# Patient Record
Sex: Male | Born: 1960 | Race: White | Hispanic: No | Marital: Married | Smoking: Former smoker
Health system: Southern US, Community
[De-identification: ages and names within clinical notes are randomized; demographics above are authoritative.]

## PROBLEM LIST (undated history)

## (undated) HISTORY — PX: MOUTH SURGERY: SHX715

---

## 1998-01-10 ENCOUNTER — Other Ambulatory Visit: Admission: RE | Admit: 1998-01-10 | Discharge: 1998-01-10 | Payer: Self-pay | Admitting: Family Medicine

## 2006-05-08 ENCOUNTER — Ambulatory Visit: Payer: Self-pay | Admitting: Internal Medicine

## 2006-05-08 LAB — CONVERTED CEMR LAB
Albumin: 4.1 g/dL (ref 3.5–5.2)
Alkaline Phosphatase: 34 units/L — ABNORMAL LOW (ref 39–117)
Basophils Absolute: 0 10*3/uL (ref 0.0–0.1)
CO2: 30 meq/L (ref 19–32)
Chol/HDL Ratio, serum: 4.6
Creatinine, Ser: 1 mg/dL (ref 0.4–1.5)
Glomerular Filtration Rate, Af Am: 103 mL/min/{1.73_m2}
Glucose, Bld: 88 mg/dL (ref 70–99)
Ketones, ur: NEGATIVE mg/dL
MCHC: 33.5 g/dL (ref 30.0–36.0)
Monocytes Relative: 10.3 % (ref 3.0–11.0)
Neutro Abs: 3.1 10*3/uL (ref 1.4–7.7)
Neutrophils Relative %: 52.6 % (ref 43.0–77.0)
Nitrite: NEGATIVE
Platelets: 235 10*3/uL (ref 150–400)
RBC: 4.75 M/uL (ref 4.22–5.81)
RDW: 12.6 % (ref 11.5–14.6)
Sed Rate: 1 mm/hr (ref 0–20)
Sodium: 142 meq/L (ref 135–145)
Specific Gravity, Urine: 1.015 (ref 1.000–1.03)
Total Bilirubin: 1.2 mg/dL (ref 0.3–1.2)
Total Protein, Urine: NEGATIVE mg/dL
Total Protein: 7.5 g/dL (ref 6.0–8.3)
Triglyceride fasting, serum: 65 mg/dL (ref 0–149)
Urine Glucose: NEGATIVE mg/dL
Urobilinogen, UA: 0.2 (ref 0.0–1.0)

## 2006-05-30 ENCOUNTER — Ambulatory Visit: Payer: Self-pay | Admitting: Internal Medicine

## 2007-03-25 ENCOUNTER — Ambulatory Visit: Payer: Self-pay | Admitting: Internal Medicine

## 2007-03-25 LAB — CONVERTED CEMR LAB
ALT: 24 units/L (ref 0–53)
AST: 40 units/L — ABNORMAL HIGH (ref 0–37)
Albumin: 4 g/dL (ref 3.5–5.2)
Alkaline Phosphatase: 49 units/L (ref 39–117)
BUN: 9 mg/dL (ref 6–23)
Basophils Absolute: 0 10*3/uL (ref 0.0–0.1)
Basophils Relative: 0.5 % (ref 0.0–1.0)
CO2: 29 meq/L (ref 19–32)
Calcium: 9.1 mg/dL (ref 8.4–10.5)
Chloride: 103 meq/L (ref 96–112)
Creatinine, Ser: 0.9 mg/dL (ref 0.4–1.5)
HCT: 43.7 % (ref 39.0–52.0)
Hemoglobin, Urine: NEGATIVE
Lymphocytes Relative: 24.4 % (ref 12.0–46.0)
MCV: 93.5 fL (ref 78.0–100.0)
Monocytes Absolute: 0.6 10*3/uL (ref 0.2–0.7)
Nitrite: NEGATIVE
Platelets: 224 10*3/uL (ref 150–400)
Potassium: 4.3 meq/L (ref 3.5–5.1)
Total Bilirubin: 1.5 mg/dL — ABNORMAL HIGH (ref 0.3–1.2)
Total Protein: 7.6 g/dL (ref 6.0–8.3)
Urine Glucose: NEGATIVE mg/dL
WBC: 6.9 10*3/uL (ref 4.5–10.5)

## 2007-03-28 ENCOUNTER — Emergency Department (HOSPITAL_COMMUNITY): Admission: EM | Admit: 2007-03-28 | Discharge: 2007-03-28 | Payer: Self-pay | Admitting: Emergency Medicine

## 2007-05-16 ENCOUNTER — Encounter: Payer: Self-pay | Admitting: Internal Medicine

## 2008-02-16 ENCOUNTER — Ambulatory Visit: Payer: Self-pay | Admitting: Internal Medicine

## 2008-02-16 ENCOUNTER — Encounter (INDEPENDENT_AMBULATORY_CARE_PROVIDER_SITE_OTHER): Payer: Self-pay | Admitting: *Deleted

## 2008-02-16 DIAGNOSIS — M25519 Pain in unspecified shoulder: Secondary | ICD-10-CM | POA: Insufficient documentation

## 2009-04-06 ENCOUNTER — Ambulatory Visit: Payer: Self-pay | Admitting: Internal Medicine

## 2009-04-06 LAB — CONVERTED CEMR LAB
AST: 18 units/L (ref 0–37)
Albumin: 4.3 g/dL (ref 3.5–5.2)
Alkaline Phosphatase: 33 units/L — ABNORMAL LOW (ref 39–117)
BUN: 17 mg/dL (ref 6–23)
Bilirubin Urine: NEGATIVE
CO2: 30 meq/L (ref 19–32)
Chloride: 105 meq/L (ref 96–112)
Cholesterol: 256 mg/dL — ABNORMAL HIGH (ref 0–200)
Creatinine, Ser: 0.8 mg/dL (ref 0.4–1.5)
Eosinophils Relative: 4 % (ref 0.0–5.0)
Glucose, Bld: 82 mg/dL (ref 70–99)
HCT: 45.9 % (ref 39.0–52.0)
Hemoglobin: 15.5 g/dL (ref 13.0–17.0)
Ketones, ur: NEGATIVE mg/dL
Lymphs Abs: 2 10*3/uL (ref 0.7–4.0)
MCV: 95.9 fL (ref 78.0–100.0)
Monocytes Absolute: 0.6 10*3/uL (ref 0.1–1.0)
Monocytes Relative: 11.6 % (ref 3.0–12.0)
Neutro Abs: 2 10*3/uL (ref 1.4–7.7)
Platelets: 210 10*3/uL (ref 150.0–400.0)
Potassium: 4.3 meq/L (ref 3.5–5.1)
RDW: 12.3 % (ref 11.5–14.6)
Specific Gravity, Urine: 1.02 (ref 1.000–1.030)
Total Bilirubin: 1.3 mg/dL — ABNORMAL HIGH (ref 0.3–1.2)
Total CHOL/HDL Ratio: 5
Urine Glucose: NEGATIVE mg/dL
VLDL: 15.2 mg/dL (ref 0.0–40.0)
pH: 7 (ref 5.0–8.0)

## 2009-04-08 ENCOUNTER — Ambulatory Visit: Payer: Self-pay | Admitting: Internal Medicine

## 2009-04-08 DIAGNOSIS — N529 Male erectile dysfunction, unspecified: Secondary | ICD-10-CM

## 2009-04-08 DIAGNOSIS — R209 Unspecified disturbances of skin sensation: Secondary | ICD-10-CM

## 2009-04-08 DIAGNOSIS — E785 Hyperlipidemia, unspecified: Secondary | ICD-10-CM

## 2010-05-09 ENCOUNTER — Ambulatory Visit: Payer: Self-pay | Admitting: Internal Medicine

## 2010-05-11 LAB — CONVERTED CEMR LAB
ALT: 18 units/L (ref 0–53)
AST: 22 units/L (ref 0–37)
Albumin: 4 g/dL (ref 3.5–5.2)
Alkaline Phosphatase: 37 units/L — ABNORMAL LOW (ref 39–117)
BUN: 11 mg/dL (ref 6–23)
CO2: 30 meq/L (ref 19–32)
Chloride: 106 meq/L (ref 96–112)
Direct LDL: 170.2 mg/dL
Glucose, Bld: 87 mg/dL (ref 70–99)
Potassium: 5.5 meq/L — ABNORMAL HIGH (ref 3.5–5.1)
Sodium: 141 meq/L (ref 135–145)
Total Protein: 6.9 g/dL (ref 6.0–8.3)
VLDL: 14.4 mg/dL (ref 0.0–40.0)

## 2010-06-01 ENCOUNTER — Ambulatory Visit: Payer: Self-pay | Admitting: Internal Medicine

## 2010-06-08 ENCOUNTER — Ambulatory Visit: Payer: Self-pay | Admitting: Internal Medicine

## 2010-06-08 ENCOUNTER — Encounter: Payer: Self-pay | Admitting: Internal Medicine

## 2010-06-09 LAB — CONVERTED CEMR LAB
Basophils Relative: 0.4 % (ref 0.0–3.0)
Eosinophils Relative: 2.1 % (ref 0.0–5.0)
HCT: 44.3 % (ref 39.0–52.0)
Hemoglobin: 15.4 g/dL (ref 13.0–17.0)
Ketones, ur: NEGATIVE mg/dL
Leukocytes, UA: NEGATIVE
Lymphs Abs: 2.3 10*3/uL (ref 0.7–4.0)
Monocytes Relative: 10.1 % (ref 3.0–12.0)
Nitrite: NEGATIVE
PSA: 1.06 ng/mL (ref 0.10–4.00)
Platelets: 216 10*3/uL (ref 150.0–400.0)
RBC: 4.66 M/uL (ref 4.22–5.81)
Specific Gravity, Urine: 1.01 (ref 1.000–1.030)
WBC: 5.9 10*3/uL (ref 4.5–10.5)
pH: 7.5 (ref 5.0–8.0)

## 2010-08-01 ENCOUNTER — Encounter (INDEPENDENT_AMBULATORY_CARE_PROVIDER_SITE_OTHER): Payer: Self-pay | Admitting: *Deleted

## 2010-08-29 NOTE — Assessment & Plan Note (Signed)
Summary: cpx-lb   Vital Signs:  Patient profile:   50 year old male Height:      67 inches Weight:      157 pounds BMI:     24.68 O2 Sat:      96 % on Room air Temp:     98.3 degrees F oral Pulse rate:   59 / minute BP sitting:   130 / 80  (left arm) Cuff size:   regular  Vitals Entered By: Alysia Penna (June 08, 2010 2:06 PM)  O2 Flow:  Room air CC: pt here for cpx /cp sma Comments pt is no longer taking nortriptyline.   CC:  pt here for cpx /cp sma.  History of Present Illness: The patient presents for a preventive health examination   Current Medications (verified): 1)  Ibuprofen 600 Mg  Tabs (Ibuprofen) .... Two Times A Day As Needed 2)  Nortriptyline Hcl 10 Mg Caps (Nortriptyline Hcl) .Marland Kitchen.. 1 By Mouth Qhs 3)  Vitamin D3 1000 Unit  Tabs (Cholecalciferol) .Marland Kitchen.. 1 By Mouth Daily 4)  Cialis 20 Mg Tabs (Tadalafil) .Marland Kitchen.. 1 Tablet Every Other Day As Needed For Erectile Dysfunction  Allergies (verified): 1)  ! Zoloft (Sertraline Hcl)  Past History:  Past Medical History: Last updated: 04/08/2009 H. pylori  041.86 Hyperlipidemia  Past Surgical History: Last updated: 02/16/2008 Denies surgical history  Social History: Last updated: 04/08/2009 Occupation: Hydrologist Married Former Smoker Regular exercise-yes  Family History: Family History Hypertension F melanoma 53, CVA   Review of Systems  The patient denies anorexia, fever, weight loss, weight gain, vision loss, decreased hearing, hoarseness, chest pain, syncope, dyspnea on exertion, peripheral edema, prolonged cough, headaches, hemoptysis, abdominal pain, melena, hematochezia, severe indigestion/heartburn, hematuria, incontinence, genital sores, muscle weakness, suspicious skin lesions, transient blindness, difficulty walking, depression, unusual weight change, abnormal bleeding, enlarged lymph nodes, angioedema, and testicular masses.    Physical Exam  General:   Well-developed,well-nourished,in no acute distress; alert,appropriate and cooperative throughout examination Head:  Normocephalic and atraumatic without obvious abnormalities. No apparent alopecia or balding. Eyes:  No corneal or conjunctival inflammation noted. EOMI. Perrla. Ears:  External ear exam shows no significant lesions or deformities.  Otoscopic examination reveals clear canals, tympanic membranes are intact bilaterally without bulging, retraction, inflammation or discharge. Hearing is grossly normal bilaterally. Nose:  External nasal examination shows no deformity or inflammation. Nasal mucosa are pink and moist without lesions or exudates. Mouth:  Oral mucosa and oropharynx without lesions or exudates.  Teeth in good repair. Neck:  No deformities, masses, or tenderness noted. Lungs:  Normal respiratory effort, chest expands symmetrically. Lungs are clear to auscultation, no crackles or wheezes. Heart:  Normal rate and regular rhythm. S1 and S2 normal without gallop, murmur, click, rub or other extra sounds. Abdomen:  Bowel sounds positive,abdomen soft and non-tender without masses, organomegaly or hernias noted. Rectal:  No external abnormalities noted. Normal sphincter tone. No rectal masses or tenderness. G(-) Genitalia:  Testes bilaterally descended without nodularity, tenderness or masses. No scrotal masses or lesions. No penis lesions or urethral discharge. Prostate:  Prostate gland firm and smooth, no enlargement, nodularity, tenderness, mass, asymmetry or induration. Msk:  R clavicle is separated upwards in the [facility] joint; NT; atrophic anter. deltoid. AC is not tender w/ROM Pulses:  R and L carotid,radial,femoral,dorsalis pedis and posterior tibial pulses are full and equal bilaterally Extremities:  No clubbing, cyanosis, edema, or deformity noted with normal full range of motion of all joints.   Neurologic:  No  cranial nerve deficits noted. Station and gait are normal. Plantar  reflexes are down-going bilaterally. DTRs are symmetrical throughout. Sensory, motor and coordinative functions appear intact. Skin:  Intact without suspicious lesions or rashes Cervical Nodes:  No lymphadenopathy noted Psych:  Cognition and judgment appear intact. Alert and cooperative with normal attention span and concentration. No apparent delusions, illusions, hallucinations   Impression & Recommendations:  Problem # 1:  PHYSICAL EXAMINATION (ICD-V70.0) Assessment New Health and age related issues were discussed. Available screening tests and vaccinations were discussed as well. Healthy life style including good diet and exercise was discussed.  Labs ordered Orders: EKG w/ Interpretation (93000) Gastroenterology Referral (GI) TLB-CBC Platelet - w/Differential (85025-CBCD) TLB-PSA (Prostate Specific Antigen) (84153-PSA) TLB-Udip ONLY (81003-UDIP) TLB-TSH (Thyroid Stimulating Hormone) (84443-TSH)  Problem # 2:  HYPERLIPIDEMIA (ICD-272.4) Assessment: Comment Only Declined statins  Problem # 3:  ERECTILE DYSFUNCTION (FIE-332.95) Assessment: Unchanged  The following medications were removed from the medication list:    Cialis 20 Mg Tabs (Tadalafil) .Marland Kitchen... 1 tablet every other day as needed for erectile dysfunction His updated medication list for this problem includes:    Viagra 100 Mg Tabs (Sildenafil citrate) .Marland Kitchen... 1 by mouth once daily prn  Complete Medication List: 1)  Vitamin D3 1000 Unit Tabs (Cholecalciferol) .Marland Kitchen.. 1 by mouth daily 2)  Viagra 100 Mg Tabs (Sildenafil citrate) .Marland Kitchen.. 1 by mouth once daily prn  Other Orders: Tdap => 12yrs IM (18841) Admin 1st Vaccine (66063)  Patient Instructions: 1)  Please schedule a follow-up appointment in 1 year with labs. Prescriptions: VIAGRA 100 MG TABS (SILDENAFIL CITRATE) 1 by mouth once daily prn  #12 x 6   Entered and Authorized by:   Tresa Garter MD   Signed by:   Tresa Garter MD on 06/08/2010   Method used:    Print then Give to Patient   RxID:   [id number]    Orders Added: 1)  EKG w/ Interpretation [93000] 2)  Tdap => 65yrs IM [90715] 3)  Admin 1st Vaccine [90471] 4)  Gastroenterology Referral [GI] 5)  TLB-CBC Platelet - w/Differential [85025-CBCD] 6)  TLB-PSA (Prostate Specific Antigen) [84153-PSA] 7)  TLB-Udip ONLY [81003-UDIP] 8)  TLB-TSH (Thyroid Stimulating Hormone) [84443-TSH] 9)  Est. Patient age 69-64 [83]   Immunizations Administered:  Tetanus Vaccine:    Vaccine Type: Tdap    Site: left deltoid    Mfr: GlaxoSmithKline    Dose: 0.5 ml    Route: IM    Given by: Alysia Penna    Exp. Date: 05/18/2012    Lot #: [id number]   Immunizations Administered:  Tetanus Vaccine:    Vaccine Type: Tdap    Site: left deltoid    Mfr: GlaxoSmithKline    Dose: 0.5 ml    Route: IM    Given by: Alysia Penna    Exp. Date: 05/18/2012    Lot #: [id number]

## 2010-08-31 NOTE — Letter (Signed)
Summary: Referral - not able to see patient  [facility]  [address] [place], [place] [address]   Phone: [phone]  Fax: [phone]    August 01, 2010   Georgina Quint. Plotnikov, M.D. [address] [place], [place] [address]   Re:   MCKENNON ZWART DOB:  25-Jan-1961 MRN:   [id number]    Dear Dr. Posey Rea:  Thank you for your kind referral of the above patient.  We have attempted to schedule the recommended procedure Screening Colonoscopy but have not been able to schedule because:   X  The patient was not available by phone and/or has not returned our calls.  ___ The patient declined to schedule the procedure at this time.  We appreciate the referral and hope that we will have the opportunity to treat this patient in the future.    Sincerely,    [facility] [phone]

## 2010-11-01 ENCOUNTER — Other Ambulatory Visit: Payer: Self-pay | Admitting: Internal Medicine

## 2010-11-02 NOTE — Telephone Encounter (Signed)
Med is not active in [facility]....Marland KitchenMarland KitchenOk to Rf?

## 2010-11-06 NOTE — Telephone Encounter (Signed)
OK to fill this prescription with additional refills x1 Thank you!  

## 2010-12-15 NOTE — Assessment & Plan Note (Signed)
[facility]                             PRIMARY CARE OFFICE NOTE   HAMDI, KLEY                     MRN:          [id number]  DATE:05/08/2006                            DOB:          1960/11/08    The patient is a 50 year old male who presents for well examination.  Past  medical history, family history, social history as per April 02, 2000,  note.  He plays soccer twice a week, continues to work for [employer].  Quit  smoking 3-4 years ago.   MEDICINES:  None at present.   ALLERGIES:  ZOLOFT caused nausea.   REVIEW OF SYSTEMS:  No chest pain or shortness of breath.  Had some rectal  pain after a soccer game the other day.  No blood or discharge, no diarrhea.  Joint aches after soccer games.  One to two nights a month he has a burning  sensation in the feet that wakes him up from sleep and he has to use a  spiked roller.  The rest is negative.   PHYSICAL:  VITAL SIGNS:  Blood pressure 128/84, pulse 58, temperature 98.7,  weight 160 pounds.  GENERAL:  Looks well.  HEENT:  With moist mucosa.  NECK:  Supple, no thyromegaly or bruit.  LUNGS:  Clear, no wheeze or rales.  HEART:  S1, S2, no murmur, no gallop.  ABDOMEN:  Soft, nontender, no organomegaly or mass felt.  LOWER EXTREMITIES:  Without edema.  NEUROLOGIC:  He is alert, oriented and cooperative.  Denies being depressed.  SKIN:  There are a number of moles with somewhat irregular color and shape -  two on the right foot dorsally, one in the right inguinal area, and one on  the right upper buttock.  RECTAL:  Reveals normal prostate.  There is an oval, mobile, slightly  sensitive mass at 6 o'clock right past the anus.  Stool guaiac negative.  MUSCULOSKELETAL:  Joints without deformities.   Laboratories done this morning pending.   ASSESSMENT AND PLAN:  Normal wellness examination.  Age/health related  issues discussed, healthy lifestyle discussed.  Repeat exam in 12 months.  Will  review labs later.  May need to get a chest x-ray next year.  Refused a  flu shot.            ______________________________  Georgina Quint. Plotnikov, MD   AVP/MedQ DD:  05/08/2006 DT:  05/10/2006 Job #:  [id number]

## 2010-12-15 NOTE — Assessment & Plan Note (Signed)
[facility]                             PRIMARY CARE OFFICE NOTE   DUGAN, VANHOESEN                     MRN:          [id number]  DATE:05/30/2006                            DOB:          [birth year]    PROCEDURE:  Skin biopsy.   INDICATION:  Moles with irregular color.  Family history of melanoma.  The  risks including incomplete procedure, bleeding, infection, and others as  well as benefits were explained to the patient in detail.  He agreed to  proceed.   He was placed in decubitus position.  Mole #1, measuring 6 mm, right groin  area prepped with Betadine and alcohol.  Local with 0.5 mL with 2% lidocaine  with epinephrine.  Shave biopsy with Dermablade performed.  Wound treated  with Hyfrecator, Band-Aid with antibiotic ointment.  Tolerated well.  Complications:  None.  Specimen sent to the lab.   Mole #2, measuring 3 mm, right lateral foot was removed in identical  fashion.  Specimen sent to the lab.   Mole #3, measuring 4 mm, right medial foot was removed in the identical  fashion.  Specimen sent to the lab.   Mole #4, measuring 3 mm, right lumbar area was removed in the identical  fashion.  Specimen sent to the lab.   Mole #5, measuring 4 mm, left lumbar area was removed in the identical  fashion. Specimen sent to the lab.  Tolerated this well.  Complications  none.    ______________________________  Georgina Quint. Plotnikov, MD    AVP/MedQ  DD: 05/30/2006  DT: 05/30/2006  Job #: [id number]

## 2010-12-15 NOTE — Assessment & Plan Note (Signed)
[facility]                             PRIMARY CARE OFFICE NOTE   Chase Bennett, Chase Bennett                     MRN:          [id number]  DATE:05/08/2006                            DOB:          [birth year]    The patient is a 50 year old male who presents today to address complaints  with joint aches after soccer; burning sensation in the feet which is  severe, one to two times a month, usually at nighttime; rectal pain, one  episode; moles.   Past medical history, family history, social history as per April 02, 2000.  He plays soccer twice a week.   MEDICINES:  None regular.   REVIEW OF SYSTEMS:  As above.   PHYSICAL EXAMINATION:  VITAL SIGNS:  Blood pressure 128/84, pulse 58,  temperature 98.7, weight 160 pounds.  GENERAL:  Looks well.  HEENT:  Moist mucosa.  NECK:  Supple, no thyromegaly or bruit.  LUNGS:  Clear, no wheeze or rales.  HEART:  S1, S2.  No murmur, no gallop.  ABDOMEN:  Soft, nontender.  No organomegaly, no mass felt.  RECTAL:  Exam as described in previous note.  SKIN:  Exam as described in previous note.   Labs pending from this morning.   ASSESSMENT AND PLAN:  1. Arthralgias after soccer game.  Prescribed ibuprofen 600 p.o. b.i.d. to      t.i.d. p.r.n., take with meals.  No signs of inflammatory arthritis.  2. Burning paresthesia in the feet, sporadic.  There is no pattern.  Could      be allergic.  Just in case, I gave him Neurontin 100 mg one to two at      night.  Side effects discussed.  He will try to figure out what is      really causing it.  Check B12 and sed rate in addition.  3. Rectal pain, likely a hemorrhoid.  Proctocort HC suppository one twice      a day for 10 days      prescribed.  I will see him back in about a month.  Will do an office      exam.  4. Moles with family history of melanoma.  Will schedule biopsy in a month      of four moles.            ______________________________  Georgina Quint Plotnikov, MD   AVP/MedQ DD:  05/08/2006 DT:  05/10/2006 Job #:  [id number]

## 2011-05-11 LAB — LIPASE, BLOOD: Lipase: 22

## 2011-05-11 LAB — DIFFERENTIAL
Basophils Absolute: 0
Basophils Relative: 0
Eosinophils Absolute: 0
Eosinophils Relative: 0
Monocytes Absolute: 0.3
Monocytes Relative: 4
Neutro Abs: 7.5

## 2011-05-11 LAB — URINALYSIS, ROUTINE W REFLEX MICROSCOPIC
Bilirubin Urine: NEGATIVE
Glucose, UA: NEGATIVE
Ketones, ur: >80 — AB
Nitrite: NEGATIVE
Specific Gravity, Urine: 1.022
pH: 7

## 2011-05-11 LAB — CBC
RBC: 5.03
WBC: 8.5

## 2011-05-11 LAB — HEPATIC FUNCTION PANEL
Bilirubin, Direct: 0.2
Indirect Bilirubin: 0.9
Total Bilirubin: 1.1

## 2011-05-11 LAB — I-STAT 8, (EC8 V) (CONVERTED LAB)
Acid-base deficit: 2
Glucose, Bld: 122 — ABNORMAL HIGH
TCO2: 23
pCO2, Ven: 33.7 — ABNORMAL LOW
pH, Ven: 7.422 — ABNORMAL HIGH

## 2011-05-11 LAB — POCT I-STAT CREATININE
Creatinine, Ser: 0.8
Operator id: 272551

## 2012-06-03 ENCOUNTER — Other Ambulatory Visit (INDEPENDENT_AMBULATORY_CARE_PROVIDER_SITE_OTHER): Payer: Self-pay

## 2012-06-03 ENCOUNTER — Other Ambulatory Visit: Payer: Self-pay | Admitting: *Deleted

## 2012-06-03 DIAGNOSIS — Z0389 Encounter for observation for other suspected diseases and conditions ruled out: Secondary | ICD-10-CM

## 2012-06-03 DIAGNOSIS — Z0000 Encounter for general adult medical examination without abnormal findings: Secondary | ICD-10-CM

## 2012-06-03 DIAGNOSIS — E785 Hyperlipidemia, unspecified: Secondary | ICD-10-CM

## 2012-06-03 LAB — URINALYSIS, ROUTINE W REFLEX MICROSCOPIC
Ketones, ur: NEGATIVE
Leukocytes, UA: NEGATIVE
Specific Gravity, Urine: 1.025 (ref 1.000–1.030)
Total Protein, Urine: NEGATIVE
Urine Glucose: NEGATIVE
pH: 6.5 (ref 5.0–8.0)

## 2012-06-03 LAB — PSA: PSA: 1.36 ng/mL (ref 0.10–4.00)

## 2012-06-03 LAB — BASIC METABOLIC PANEL
Calcium: 9.2 mg/dL (ref 8.4–10.5)
GFR: 97.82 mL/min (ref >60.00)
Potassium: 6.1 mEq/L (ref 3.5–5.1)
Sodium: 139 mEq/L (ref 135–145)

## 2012-06-03 LAB — LIPID PANEL
HDL: 68.2 mg/dL (ref >39.00)
Total CHOL/HDL Ratio: 4
Triglycerides: 83 mg/dL (ref 0.0–149.0)
VLDL: 16.6 mg/dL (ref 0.0–40.0)

## 2012-06-03 LAB — CBC WITH DIFFERENTIAL/PLATELET
Basophils Relative: 0.6 % (ref 0.0–3.0)
Eosinophils Relative: 3.5 % (ref 0.0–5.0)
Hemoglobin: 15.1 g/dL (ref 13.0–17.0)
Lymphocytes Relative: 39.1 % (ref 12.0–46.0)
MCHC: 32.8 g/dL (ref 30.0–36.0)
MCV: 96.3 fl (ref 78.0–100.0)
Neutro Abs: 2.4 10*3/uL (ref 1.4–7.7)
Neutrophils Relative %: 47.3 % (ref 43.0–77.0)
RBC: 4.78 Mil/uL (ref 4.22–5.81)
WBC: 5 10*3/uL (ref 4.5–10.5)

## 2012-06-03 LAB — HEPATIC FUNCTION PANEL
AST: 23 U/L (ref 0–37)
Albumin: 4.1 g/dL (ref 3.5–5.2)
Alkaline Phosphatase: 37 U/L — ABNORMAL LOW (ref 39–117)
Bilirubin, Direct: 0.1 mg/dL (ref 0.0–0.3)
Total Bilirubin: 0.9 mg/dL (ref 0.3–1.2)

## 2012-06-04 ENCOUNTER — Telehealth: Payer: Self-pay | Admitting: Internal Medicine

## 2012-06-04 DIAGNOSIS — R899 Unspecified abnormal finding in specimens from other organs, systems and tissues: Secondary | ICD-10-CM

## 2012-06-04 NOTE — Telephone Encounter (Signed)
Pt informed

## 2012-06-04 NOTE — Telephone Encounter (Signed)
pls ask the pt to come back: please re-draw BMET due to elev K. I think it is a lab error. Thx

## 2012-06-05 ENCOUNTER — Other Ambulatory Visit (INDEPENDENT_AMBULATORY_CARE_PROVIDER_SITE_OTHER): Payer: Self-pay

## 2012-06-05 DIAGNOSIS — R6889 Other general symptoms and signs: Secondary | ICD-10-CM

## 2012-06-05 DIAGNOSIS — R899 Unspecified abnormal finding in specimens from other organs, systems and tissues: Secondary | ICD-10-CM

## 2012-06-05 LAB — BASIC METABOLIC PANEL
Calcium: 9.2 mg/dL (ref 8.4–10.5)
GFR: 99.13 mL/min (ref >60.00)
Glucose, Bld: 88 mg/dL (ref 70–99)
Sodium: 139 mEq/L (ref 135–145)

## 2012-06-10 ENCOUNTER — Ambulatory Visit (INDEPENDENT_AMBULATORY_CARE_PROVIDER_SITE_OTHER): Payer: BC Managed Care – PPO | Admitting: Internal Medicine

## 2012-06-10 ENCOUNTER — Encounter: Payer: Self-pay | Admitting: Internal Medicine

## 2012-06-10 VITALS — BP 130/90 | HR 80 | Temp 97.7°F | Resp 16 | Ht 69.0 in | Wt 150.0 lb

## 2012-06-10 DIAGNOSIS — J3489 Other specified disorders of nose and nasal sinuses: Secondary | ICD-10-CM

## 2012-06-10 DIAGNOSIS — E785 Hyperlipidemia, unspecified: Secondary | ICD-10-CM

## 2012-06-10 DIAGNOSIS — Z0000 Encounter for general adult medical examination without abnormal findings: Secondary | ICD-10-CM

## 2012-06-10 DIAGNOSIS — H547 Unspecified visual loss: Secondary | ICD-10-CM

## 2012-06-10 MED ORDER — MUPIROCIN 2 % EX OINT: TOPICAL_OINTMENT | CUTANEOUS | Status: DC

## 2012-06-10 MED ORDER — HYDROCORTISONE ACETATE 30 MG RE SUPP: RECTAL | Status: DC

## 2012-06-10 MED ORDER — VITAMIN D 1000 UNITS PO TABS: 1000.0000 [IU] | ORAL_TABLET | Freq: Every day | ORAL | Status: DC

## 2012-06-10 NOTE — Assessment & Plan Note (Signed)
Irrigate nose w/Netty pot Mupirocin oint bid

## 2012-06-10 NOTE — Assessment & Plan Note (Addendum)
Chronic - high HDL,LDL Declined Rx

## 2012-06-10 NOTE — Assessment & Plan Note (Addendum)
We discussed age appropriate health related issues, including available/recomended screening tests and vaccinations. We discussed a need for adhering to healthy diet and exercise. Labs/EKG were reviewed/ordered. All questions were answered. He declined vaccinations, colonoscopy Opth cons Skin exams

## 2012-06-10 NOTE — Progress Notes (Signed)
Subjective:    Patient ID: Chase Bennett, male    DOB: 04-29-1961, 51 y.o.   MRN: [id number]  HPI  The patient is here for a wellness exam. The patient has been doing well overall without major physical or psychological issues going on lately. C/o a sore in the nose x 2-3 wks  Wt Readings from Last 3 Encounters:  06/10/12 150 lb (68.04 kg)  06/08/10 157 lb (71.215 kg)  04/08/09 154 lb (69.854 kg)   Wt Readings from Last 3 Encounters:  06/10/12 150 lb (68.04 kg)  06/08/10 157 lb (71.215 kg)  04/08/09 154 lb (69.854 kg)      Review of Systems  Constitutional: Negative for appetite change, fatigue and unexpected weight change.  HENT: Negative for nosebleeds, congestion, sore throat, sneezing, trouble swallowing and neck pain.   Eyes: Negative for itching and visual disturbance.  Respiratory: Negative for cough.   Cardiovascular: Negative for chest pain, palpitations and leg swelling.  Gastrointestinal: Negative for nausea, diarrhea, blood in stool and abdominal distention.  Genitourinary: Negative for frequency and hematuria.  Musculoskeletal: Negative for back pain, joint swelling and gait problem.  Skin: Negative for rash.  Neurological: Negative for dizziness, tremors, speech difficulty and weakness.  Psychiatric/Behavioral: Negative for sleep disturbance, dysphoric mood and agitation. The patient is not nervous/anxious.        Objective:   Physical Exam  Constitutional: He is oriented to person, place, and time. He appears well-developed and well-nourished. No distress.  HENT:  Head: Normocephalic and atraumatic.  Right Ear: External ear normal.  Left Ear: External ear normal.  Nose: Nose normal.  Mouth/Throat: Oropharynx is clear and moist. No oropharyngeal exudate.  Eyes: Conjunctivae normal and EOM are normal. Pupils are equal, round, and reactive to light. Right eye exhibits no discharge. Left eye exhibits no discharge. No scleral icterus.  Neck: Normal range of  motion. Neck supple. No JVD present. No tracheal deviation present. No thyromegaly present.  Cardiovascular: Normal rate, regular rhythm, normal heart sounds and intact distal pulses.  Exam reveals no gallop and no friction rub.   No murmur heard. Pulmonary/Chest: Effort normal and breath sounds normal. No stridor. No respiratory distress. He has no wheezes. He has no rales. He exhibits no tenderness.  Abdominal: Soft. Bowel sounds are normal. He exhibits no distension and no mass. There is no tenderness. There is no rebound and no guarding.  Genitourinary: Rectum normal, prostate normal and penis normal. Guaiac negative stool. No penile tenderness.  Musculoskeletal: Normal range of motion. He exhibits no edema and no tenderness.  Lymphadenopathy:    He has no cervical adenopathy.  Neurological: He is alert and oriented to person, place, and time. He has normal reflexes. No cranial nerve deficit. He exhibits normal muscle tone. Coordination normal.  Skin: Skin is warm and dry. No rash noted. He is not diaphoretic. No erythema. No pallor.  Psychiatric: He has a normal mood and affect. His behavior is normal. Judgment and thought content normal.    Lab Results  Component Value Date   WBC 5.0 06/03/2012   HGB 15.1 06/03/2012   HCT 46.0 06/03/2012   PLT 227.0 06/03/2012   GLUCOSE 88 06/05/2012   CHOL 286* 06/03/2012   TRIG 83.0 06/03/2012   HDL 68.20 06/03/2012   LDLDIRECT 186.0 06/03/2012   ALT 23 06/03/2012   AST 23 06/03/2012   NA 139 06/05/2012   K 4.7 06/05/2012   CL 104 06/05/2012   CREATININE 0.9 06/05/2012   BUN 15  06/05/2012   CO2 30 06/05/2012   TSH 3.12 06/03/2012   PSA 1.36 06/03/2012         Assessment & Plan:

## 2012-06-11 ENCOUNTER — Encounter: Payer: Self-pay | Admitting: Internal Medicine

## 2013-11-03 ENCOUNTER — Ambulatory Visit (INDEPENDENT_AMBULATORY_CARE_PROVIDER_SITE_OTHER): Payer: PRIVATE HEALTH INSURANCE | Admitting: Internal Medicine

## 2013-11-03 ENCOUNTER — Encounter: Payer: Self-pay | Admitting: Internal Medicine

## 2013-11-03 ENCOUNTER — Other Ambulatory Visit (INDEPENDENT_AMBULATORY_CARE_PROVIDER_SITE_OTHER): Payer: PRIVATE HEALTH INSURANCE

## 2013-11-03 VITALS — BP 140/84 | HR 80 | Resp 16 | Ht 69.25 in | Wt 146.0 lb

## 2013-11-03 DIAGNOSIS — Z0000 Encounter for general adult medical examination without abnormal findings: Secondary | ICD-10-CM

## 2013-11-03 LAB — PSA: PSA: 1.34 ng/mL (ref 0.10–4.00)

## 2013-11-03 LAB — URINALYSIS
BILIRUBIN URINE: NEGATIVE
Hgb urine dipstick: NEGATIVE
KETONES UR: NEGATIVE
LEUKOCYTES UA: NEGATIVE
Nitrite: NEGATIVE
PH: 8 (ref 5.0–8.0)
SPECIFIC GRAVITY, URINE: 1.02 (ref 1.000–1.030)
TOTAL PROTEIN, URINE-UPE24: NEGATIVE
Urine Glucose: NEGATIVE
Urobilinogen, UA: 0.2 (ref 0.0–1.0)

## 2013-11-03 LAB — CBC WITH DIFFERENTIAL/PLATELET
BASOS ABS: 0 10*3/uL (ref 0.0–0.1)
Basophils Relative: 0.5 % (ref 0.0–3.0)
EOS ABS: 0.1 10*3/uL (ref 0.0–0.7)
Eosinophils Relative: 1.9 % (ref 0.0–5.0)
HEMATOCRIT: 44.1 % (ref 39.0–52.0)
HEMOGLOBIN: 15.1 g/dL (ref 13.0–17.0)
LYMPHS ABS: 1.6 10*3/uL (ref 0.7–4.0)
Lymphocytes Relative: 35.2 % (ref 12.0–46.0)
MCHC: 34.2 g/dL (ref 30.0–36.0)
MCV: 93.6 fl (ref 78.0–100.0)
MONO ABS: 0.4 10*3/uL (ref 0.1–1.0)
MONOS PCT: 9.4 % (ref 3.0–12.0)
NEUTROS ABS: 2.4 10*3/uL (ref 1.4–7.7)
Neutrophils Relative %: 53 % (ref 43.0–77.0)
PLATELETS: 225 10*3/uL (ref 150.0–400.0)
RBC: 4.71 Mil/uL (ref 4.22–5.81)
RDW: 12.9 % (ref 11.5–14.6)
WBC: 4.6 10*3/uL (ref 4.5–10.5)

## 2013-11-03 LAB — LIPID PANEL
CHOLESTEROL: 205 mg/dL — AB (ref 0–200)
HDL: 48.4 mg/dL (ref >39.00)
LDL Cholesterol: 135 mg/dL — ABNORMAL HIGH (ref 0–99)
Total CHOL/HDL Ratio: 4
Triglycerides: 109 mg/dL (ref 0.0–149.0)
VLDL: 21.8 mg/dL (ref 0.0–40.0)

## 2013-11-03 LAB — BASIC METABOLIC PANEL
BUN: 9 mg/dL (ref 6–23)
CO2: 28 meq/L (ref 19–32)
Calcium: 9.3 mg/dL (ref 8.4–10.5)
Chloride: 104 mEq/L (ref 96–112)
Creatinine, Ser: 0.7 mg/dL (ref 0.4–1.5)
GFR: 133.82 mL/min (ref >60.00)
GLUCOSE: 79 mg/dL (ref 70–99)
POTASSIUM: 4.8 meq/L (ref 3.5–5.1)
Sodium: 140 mEq/L (ref 135–145)

## 2013-11-03 LAB — HEPATIC FUNCTION PANEL
ALK PHOS: 40 U/L (ref 39–117)
ALT: 16 U/L (ref 0–53)
AST: 18 U/L (ref 0–37)
Albumin: 4.2 g/dL (ref 3.5–5.2)
BILIRUBIN DIRECT: 0.2 mg/dL (ref 0.0–0.3)
BILIRUBIN TOTAL: 1.4 mg/dL — AB (ref 0.3–1.2)
TOTAL PROTEIN: 6.9 g/dL (ref 6.0–8.3)

## 2013-11-03 LAB — TSH: TSH: 2.52 u[IU]/mL (ref 0.35–5.50)

## 2013-11-03 MED ORDER — HYDROCORTISONE ACETATE 30 MG RE SUPP: RECTAL | Status: DC

## 2013-11-03 MED ORDER — ACYCLOVIR 400 MG PO TABS: 400.0000 mg | ORAL_TABLET | Freq: Three times a day (TID) | ORAL | Status: DC

## 2013-11-03 NOTE — Assessment & Plan Note (Addendum)
We discussed age appropriate health related issues, including available/recomended screening tests and vaccinations. We discussed a need for adhering to healthy diet and exercise. Labs/EKG were reviewed/ordered. All questions were answered. He declined vaccinations, colonoscopy. Cologuard discussed too: declines at the moment.

## 2013-11-03 NOTE — Progress Notes (Signed)
Pre visit review using our clinic review tool, if applicable. No additional management support is needed unless otherwise documented below in the visit note. 

## 2013-11-03 NOTE — Progress Notes (Signed)
Patient ID: Chase Bennett, male   DOB: [birth year], 53 y.o.   MRN: [id number]  Subjective:    HPI  The patient is here for a wellness exam. The patient has been doing well overall without major physical or psychological issues going on lately.   Wt Readings from Last 3 Encounters:  11/03/13 146 lb (66.225 kg)  06/10/12 150 lb (68.04 kg)  06/08/10 157 lb (71.215 kg)   Wt Readings from Last 3 Encounters:  11/03/13 146 lb (66.225 kg)  06/10/12 150 lb (68.04 kg)  06/08/10 157 lb (71.215 kg)      Review of Systems  Constitutional: Negative for appetite change, fatigue and unexpected weight change.  HENT: Negative for congestion, nosebleeds, sneezing, sore throat and trouble swallowing.   Eyes: Negative for itching and visual disturbance.  Respiratory: Negative for cough.   Cardiovascular: Negative for chest pain, palpitations and leg swelling.  Gastrointestinal: Negative for nausea, diarrhea, blood in stool and abdominal distention.  Genitourinary: Negative for frequency and hematuria.  Musculoskeletal: Negative for back pain, gait problem, joint swelling and neck pain.  Skin: Negative for rash.  Neurological: Negative for dizziness, tremors, speech difficulty and weakness.  Psychiatric/Behavioral: Negative for sleep disturbance, dysphoric mood and agitation. The patient is not nervous/anxious.        Objective:   Physical Exam  Constitutional: He is oriented to person, place, and time. He appears well-developed and well-nourished. No distress.  HENT:  Head: Normocephalic and atraumatic.  Right Ear: External ear normal.  Left Ear: External ear normal.  Nose: Nose normal.  Mouth/Throat: Oropharynx is clear and moist. No oropharyngeal exudate.  Eyes: Conjunctivae and EOM are normal. Pupils are equal, round, and reactive to light. Right eye exhibits no discharge. Left eye exhibits no discharge. No scleral icterus.  Neck: Normal range of motion. Neck supple. No JVD present. No  tracheal deviation present. No thyromegaly present.  Cardiovascular: Normal rate, regular rhythm, normal heart sounds and intact distal pulses.  Exam reveals no gallop and no friction rub.   No murmur heard. Pulmonary/Chest: Effort normal and breath sounds normal. No stridor. No respiratory distress. He has no wheezes. He has no rales. He exhibits no tenderness.  Abdominal: Soft. Bowel sounds are normal. He exhibits no distension and no mass. There is no tenderness. There is no rebound and no guarding.  Genitourinary: Rectum normal and penis normal. Guaiac negative stool. No penile tenderness.  Prostate 1+  Musculoskeletal: Normal range of motion. He exhibits no edema and no tenderness.  Lymphadenopathy:    He has no cervical adenopathy.  Neurological: He is alert and oriented to person, place, and time. He has normal reflexes. No cranial nerve deficit. He exhibits normal muscle tone. Coordination normal.  Skin: Skin is warm and dry. No rash noted. He is not diaphoretic. No erythema. No pallor.  Psychiatric: He has a normal mood and affect. His behavior is normal. Judgment and thought content normal.  a cold sore  Lab Results  Component Value Date   WBC 5.0 06/03/2012   HGB 15.1 06/03/2012   HCT 46.0 06/03/2012   PLT 227.0 06/03/2012   GLUCOSE 88 06/05/2012   CHOL 286* 06/03/2012   TRIG 83.0 06/03/2012   HDL 68.20 06/03/2012   LDLDIRECT 186.0 06/03/2012   ALT 23 06/03/2012   AST 23 06/03/2012   NA 139 06/05/2012   K 4.7 06/05/2012   CL 104 06/05/2012   CREATININE 0.9 06/05/2012   BUN 15 06/05/2012   CO2 30 06/05/2012  TSH 3.12 06/03/2012   PSA 1.36 06/03/2012         Assessment & Plan:

## 2014-10-13 ENCOUNTER — Ambulatory Visit (INDEPENDENT_AMBULATORY_CARE_PROVIDER_SITE_OTHER): Payer: PRIVATE HEALTH INSURANCE | Admitting: Internal Medicine

## 2014-10-13 ENCOUNTER — Encounter: Payer: Self-pay | Admitting: Internal Medicine

## 2014-10-13 VITALS — BP 122/90 | HR 68 | Ht 70.0 in | Wt 151.0 lb

## 2014-10-13 DIAGNOSIS — Z1211 Encounter for screening for malignant neoplasm of colon: Secondary | ICD-10-CM

## 2014-10-13 DIAGNOSIS — Z0000 Encounter for general adult medical examination without abnormal findings: Secondary | ICD-10-CM

## 2014-10-13 MED ORDER — GABAPENTIN 100 MG PO CAPS: 100.0000 mg | ORAL_CAPSULE | Freq: Every day | ORAL | Status: DC

## 2014-10-13 NOTE — Patient Instructions (Signed)
Aspercreme for a knee

## 2014-10-13 NOTE — Progress Notes (Signed)
Pre visit review using our clinic review tool, if applicable. No additional management support is needed unless otherwise documented below in the visit note. 

## 2014-10-13 NOTE — Assessment & Plan Note (Signed)
We discussed age appropriate health related issues, including available/recomended screening tests and vaccinations. We discussed a need for adhering to healthy diet and exercise. Labs/EKG were reviewed/ordered. All questions were answered. He declined vaccinations Colonoscopy Vit D

## 2014-10-13 NOTE — Progress Notes (Signed)
Subjective:    HPI  The patient is here for a wellness exam. The patient has been doing well overall without major physical or psychological issues going on lately.  C/o L knee pain C/o burning feet x years  Wt Readings from Last 3 Encounters:  10/13/14 151 lb (68.493 kg)  11/03/13 146 lb (66.225 kg)  06/10/12 150 lb (68.04 kg)    BP Readings from Last 3 Encounters:  10/13/14 122/90  11/03/13 140/84  06/10/12 130/90      Review of Systems  Constitutional: Negative for appetite change, fatigue and unexpected weight change.  HENT: Negative for congestion, nosebleeds, sneezing, sore throat and trouble swallowing.   Eyes: Negative for itching and visual disturbance.  Respiratory: Negative for cough.   Cardiovascular: Negative for chest pain, palpitations and leg swelling.  Gastrointestinal: Negative for nausea, diarrhea, blood in stool and abdominal distention.  Genitourinary: Negative for frequency and hematuria.  Musculoskeletal: Negative for back pain, joint swelling, gait problem and neck pain.  Skin: Negative for rash.  Neurological: Negative for dizziness, tremors, speech difficulty and weakness.  Psychiatric/Behavioral: Negative for sleep disturbance, dysphoric mood and agitation. The patient is not nervous/anxious.        Objective:   Physical Exam  Constitutional: He is oriented to person, place, and time. He appears well-developed and well-nourished. No distress.  HENT:  Head: Normocephalic and atraumatic.  Right Ear: External ear normal.  Left Ear: External ear normal.  Nose: Nose normal.  Mouth/Throat: Oropharynx is clear and moist. No oropharyngeal exudate.  Eyes: Conjunctivae and EOM are normal. Pupils are equal, round, and reactive to light. Right eye exhibits no discharge. Left eye exhibits no discharge. No scleral icterus.  Neck: Normal range of motion. Neck supple. No JVD present. No tracheal deviation present. No thyromegaly present.   Cardiovascular: Normal rate, regular rhythm, normal heart sounds and intact distal pulses.  Exam reveals no gallop and no friction rub.   No murmur heard. Pulmonary/Chest: Effort normal and breath sounds normal. No stridor. No respiratory distress. He has no wheezes. He has no rales. He exhibits no tenderness.  Abdominal: Soft. Bowel sounds are normal. He exhibits no distension and no mass. There is no tenderness. There is no rebound and no guarding.  Genitourinary: Rectum normal and penis normal. Guaiac negative stool. No penile tenderness.  Prostate 1+  Musculoskeletal: Normal range of motion. He exhibits no edema or tenderness.  Lymphadenopathy:    He has no cervical adenopathy.  Neurological: He is alert and oriented to person, place, and time. He has normal reflexes. No cranial nerve deficit. He exhibits normal muscle tone. Coordination normal.  Skin: Skin is warm and dry. No rash noted. He is not diaphoretic. No erythema. No pallor.  Psychiatric: He has a normal mood and affect. His behavior is normal. Judgment and thought content normal.    Lab Results  Component Value Date   WBC 4.6 11/03/2013   HGB 15.1 11/03/2013   HCT 44.1 11/03/2013   PLT 225.0 11/03/2013   GLUCOSE 79 11/03/2013   CHOL 205* 11/03/2013   TRIG 109.0 11/03/2013   HDL 48.40 11/03/2013   LDLDIRECT 186.0 06/03/2012   LDLCALC 135* 11/03/2013   ALT 16 11/03/2013   AST 18 11/03/2013   NA 140 11/03/2013   K 4.8 11/03/2013   CL 104 11/03/2013   CREATININE 0.7 11/03/2013   BUN 9 11/03/2013   CO2 28 11/03/2013   TSH 2.52 11/03/2013   PSA 1.34 11/03/2013  Assessment & Plan:

## 2014-10-20 ENCOUNTER — Other Ambulatory Visit (INDEPENDENT_AMBULATORY_CARE_PROVIDER_SITE_OTHER): Payer: PRIVATE HEALTH INSURANCE

## 2014-10-20 DIAGNOSIS — Z0000 Encounter for general adult medical examination without abnormal findings: Secondary | ICD-10-CM | POA: Diagnosis not present

## 2014-10-20 LAB — BASIC METABOLIC PANEL
BUN: 8 mg/dL (ref 6–23)
CALCIUM: 9.5 mg/dL (ref 8.4–10.5)
CO2: 31 mEq/L (ref 19–32)
Chloride: 103 mEq/L (ref 96–112)
Creatinine, Ser: 0.89 mg/dL (ref 0.40–1.50)
GFR: 94.43 mL/min (ref >60.00)
GLUCOSE: 80 mg/dL (ref 70–99)
Potassium: 5 mEq/L (ref 3.5–5.1)
SODIUM: 138 meq/L (ref 135–145)

## 2014-10-20 LAB — CBC WITH DIFFERENTIAL/PLATELET
BASOS ABS: 0 10*3/uL (ref 0.0–0.1)
Basophils Relative: 0.5 % (ref 0.0–3.0)
Eosinophils Absolute: 0.2 10*3/uL (ref 0.0–0.7)
Eosinophils Relative: 4 % (ref 0.0–5.0)
HCT: 46.1 % (ref 39.0–52.0)
Hemoglobin: 15.7 g/dL (ref 13.0–17.0)
Lymphocytes Relative: 45 % (ref 12.0–46.0)
Lymphs Abs: 2 10*3/uL (ref 0.7–4.0)
MCHC: 34 g/dL (ref 30.0–36.0)
MCV: 92.3 fl (ref 78.0–100.0)
MONO ABS: 0.4 10*3/uL (ref 0.1–1.0)
Monocytes Relative: 10 % (ref 3.0–12.0)
NEUTROS PCT: 40.5 % — AB (ref 43.0–77.0)
Neutro Abs: 1.8 10*3/uL (ref 1.4–7.7)
PLATELETS: 231 10*3/uL (ref 150.0–400.0)
RBC: 4.99 Mil/uL (ref 4.22–5.81)
RDW: 13.2 % (ref 11.5–15.5)
WBC: 4.4 10*3/uL (ref 4.0–10.5)

## 2014-10-20 LAB — HEPATIC FUNCTION PANEL
ALT: 16 U/L (ref 0–53)
AST: 17 U/L (ref 0–37)
Albumin: 4.3 g/dL (ref 3.5–5.2)
Alkaline Phosphatase: 39 U/L (ref 39–117)
Bilirubin, Direct: 0.2 mg/dL (ref 0.0–0.3)
Total Bilirubin: 1.5 mg/dL — ABNORMAL HIGH (ref 0.2–1.2)
Total Protein: 7 g/dL (ref 6.0–8.3)

## 2014-10-20 LAB — URINALYSIS
BILIRUBIN URINE: NEGATIVE
HGB URINE DIPSTICK: NEGATIVE
KETONES UR: NEGATIVE
Leukocytes, UA: NEGATIVE
Nitrite: NEGATIVE
PH: 8.5 — AB (ref 5.0–8.0)
Specific Gravity, Urine: 1.015 (ref 1.000–1.030)
TOTAL PROTEIN, URINE-UPE24: NEGATIVE
URINE GLUCOSE: NEGATIVE
Urobilinogen, UA: 0.2 (ref 0.0–1.0)

## 2014-10-20 LAB — TSH: TSH: 3.21 u[IU]/mL (ref 0.35–4.50)

## 2014-10-20 LAB — LIPID PANEL
CHOLESTEROL: 231 mg/dL — AB (ref 0–200)
HDL: 52.3 mg/dL (ref >39.00)
LDL Cholesterol: 157 mg/dL — ABNORMAL HIGH (ref 0–99)
NonHDL: 178.7
TRIGLYCERIDES: 108 mg/dL (ref 0.0–149.0)
Total CHOL/HDL Ratio: 4
VLDL: 21.6 mg/dL (ref 0.0–40.0)

## 2014-10-20 LAB — VITAMIN B12: VITAMIN B 12: 239 pg/mL (ref 211–911)

## 2014-10-20 LAB — PSA: PSA: 1.09 ng/mL (ref 0.10–4.00)

## 2014-10-25 MED ORDER — VITAMIN B-12 1000 MCG SL SUBL: 1.0000 | SUBLINGUAL_TABLET | Freq: Every day | SUBLINGUAL | Status: DC

## 2014-11-02 ENCOUNTER — Encounter: Payer: Self-pay | Admitting: Gastroenterology

## 2014-12-20 ENCOUNTER — Ambulatory Visit (AMBULATORY_SURGERY_CENTER): Payer: Self-pay

## 2014-12-20 VITALS — Ht 68.5 in | Wt 147.0 lb

## 2014-12-20 DIAGNOSIS — Z1211 Encounter for screening for malignant neoplasm of colon: Secondary | ICD-10-CM

## 2014-12-20 MED ORDER — MOVIPREP 100 G PO SOLR
1.0000 | Freq: Once | ORAL | Status: DC
Start: 2014-12-20 — End: 2015-01-03

## 2014-12-20 NOTE — Progress Notes (Signed)
No allergies to eggs or soy No diet/weight loss meds No home oxygen No past problems with anesthesia  Has email  Emmi instructions given for colonoscopy 

## 2015-01-03 ENCOUNTER — Ambulatory Visit (AMBULATORY_SURGERY_CENTER): Payer: PRIVATE HEALTH INSURANCE | Admitting: Gastroenterology

## 2015-01-03 ENCOUNTER — Encounter: Payer: Self-pay | Admitting: Gastroenterology

## 2015-01-03 VITALS — BP 114/71 | HR 59 | Temp 95.7°F | Resp 45 | Ht 68.0 in | Wt 147.0 lb

## 2015-01-03 DIAGNOSIS — D123 Benign neoplasm of transverse colon: Secondary | ICD-10-CM

## 2015-01-03 DIAGNOSIS — Z1211 Encounter for screening for malignant neoplasm of colon: Secondary | ICD-10-CM

## 2015-01-03 MED ORDER — SODIUM CHLORIDE 0.9 % IV SOLN: 500.0000 mL | INTRAVENOUS | Status: DC

## 2015-01-03 NOTE — Progress Notes (Signed)
Called to room to assist during endoscopic procedure.  Patient ID and intended procedure confirmed with present staff. Received instructions for my participation in the procedure from the performing physician.  

## 2015-01-03 NOTE — Patient Instructions (Signed)
ENDOSCOPY CENTER:   Refer to the procedure report that was given to you for any specific questions about what was found during the examination.  If the procedure report does not answer your questions, please call your gastroenterologist to clarify.  If you requested that your care partner not be given the details of your procedure findings, then the procedure report has been included in a sealed envelope for you to review at your convenience later.  YOU SHOULD EXPECT: Some feelings of bloating in the abdomen. Passage of more gas than usual.  Walking can help get rid of the air that was put into your GI tract during the procedure and reduce the bloating. If you had a lower endoscopy (such as a colonoscopy or flexible sigmoidoscopy) you may notice spotting of blood in your stool or on the toilet paper. If you underwent a bowel prep for your procedure, you may not have a normal bowel movement for a few days.  Please Note:  You might notice some irritation and congestion in your nose or some drainage.  This is from the oxygen used during your procedure.  There is no need for concern and it should clear up in a day or so.  SYMPTOMS TO REPORT IMMEDIATELY:   Following lower endoscopy (colonoscopy or flexible sigmoidoscopy):  Excessive amounts of blood in the stool  Significant tenderness or worsening of abdominal pains  Swelling of the abdomen that is new, acute  Fever of 100F or higher  For urgent or emergent issues, a gastroenterologist can be reached at any hour by calling [phone].   DIET: Your first meal following the procedure should be a small meal and then it is ok to progress to your normal diet. Heavy or fried foods are harder to digest and may make you feel nauseous or bloated.  Likewise, meals heavy in dairy and vegetables can increase bloating.  Drink plenty of fluids but you should avoid alcoholic beverages for 24 hours.  ACTIVITY:  You should plan to take it easy for the rest of  today and you should NOT DRIVE or use heavy machinery until tomorrow (because of the sedation medicines used during the test).    FOLLOW UP: Our staff will call the number listed on your records the next business day following your procedure to check on you and address any questions or concerns that you may have regarding the information given to you following your procedure. If we do not reach you, we will leave a message.  However, if you are feeling well and you are not experiencing any problems, there is no need to return our call.  We will assume that you have returned to your regular daily activities without incident.  If any biopsies were taken you will be contacted by phone or by letter within the next 1-3 weeks.  Please call us at [phone] if you have not heard about the biopsies in 3 weeks.    SIGNATURES/CONFIDENTIALITY: You and/or your care partner have signed paperwork which will be entered into your electronic medical record.  These signatures attest to the fact that that the information above on your After Visit Summary has been reviewed and is understood.  Full responsibility of the confidentiality of this discharge information lies with you and/or your care-partner.  Polyp information given.

## 2015-01-03 NOTE — Progress Notes (Signed)
Report to PACU, RN, vss, BBS= Clear.  

## 2015-01-03 NOTE — Op Note (Signed)
[place]  [address] [place], [address]   COLONOSCOPY PROCEDURE REPORT  PATIENT: Chase, Bennett  MR#: [id number] BIRTHDATE: [birth year] , 53  yrs. old GENDER: male ENDOSCOPIST: Milus Banister, MD REFERRED MV:[facility] Avel Sensor, M.D. PROCEDURE DATE:  01/03/2015 PROCEDURE:   Colonoscopy, screening and Colonoscopy with snare polypectomy First Screening Colonoscopy - Avg.  risk and is 50 yrs.  old or older Yes.  Prior Negative Screening - Now for repeat screening. N/A  History of Adenoma - Now for follow-up colonoscopy & has been > or = to 3 yrs.  N/A  Polyps removed today? Yes ASA CLASS:   Class II INDICATIONS:Screening for colonic neoplasia. MEDICATIONS: Monitored anesthesia care and Propofol 200 mg IV  DESCRIPTION OF PROCEDURE:   After the risks benefits and alternatives of the procedure were thoroughly explained, informed consent was obtained.  The digital rectal exam revealed no abnormalities of the rectum.   The [id number]  endoscope was introduced through the anus and advanced to the cecum, which was identified by both the appendix and ileocecal valve. No adverse events experienced.   The quality of the prep was excellent.  The instrument was then slowly withdrawn as the colon was fully examined. Estimated blood loss is zero unless otherwise noted in this procedure report.   COLON FINDINGS: A sessile polyp measuring 3 mm in size was found in the transverse colon.  A polypectomy was performed with a cold snare.  The resection was complete, the polyp tissue was completely retrieved and sent to histology.   The examination was otherwise normal.  Retroflexed views revealed no abnormalities. The time to cecum = 1.8 Withdrawal time = 6.7   The scope was withdrawn and the procedure completed. COMPLICATIONS: There were no immediate complications.  ENDOSCOPIC IMPRESSION: 1.   Sessile polyp was found in the transverse colon; polypectomy was  performed with a cold snare 2.   The examination was otherwise normal  RECOMMENDATIONS: If the polyp(s) removed today are proven to be adenomatous (pre-cancerous) polyps, you will need a repeat colonoscopy in 5 years.  Otherwise you should continue to follow colorectal cancer screening guidelines for "routine risk" patients with colonoscopy in 10 years.  You will receive a letter within 1-2 weeks with the results of your biopsy as well as final recommendations.  Please call my office if you have not received a letter after 3 weeks.  eSigned:  Milus Banister, MD 01/03/2015 10:12 AM

## 2015-01-04 ENCOUNTER — Telehealth: Payer: Self-pay | Admitting: Emergency Medicine

## 2015-01-04 NOTE — Telephone Encounter (Signed)
  Follow up Call-  Call back number 01/03/2015  Post procedure Call Back phone  # [phone]  Permission to leave phone message Yes     Patient questions:  Do you have a fever, pain , or abdominal swelling? No. Pain Score  0 *  Have you tolerated food without any problems? Yes.    Have you been able to return to your normal activities? Yes.    Do you have any questions about your discharge instructions: Diet   No. Medications  No. Follow up visit  No.  Do you have questions or concerns about your Care? No.  Actions: * If pain score is 4 or above: No action needed, pain <4.

## 2015-01-10 ENCOUNTER — Encounter: Payer: Self-pay | Admitting: Gastroenterology

## 2015-09-05 ENCOUNTER — Ambulatory Visit (INDEPENDENT_AMBULATORY_CARE_PROVIDER_SITE_OTHER): Payer: PRIVATE HEALTH INSURANCE | Admitting: Adult Health

## 2015-09-05 ENCOUNTER — Encounter: Payer: Self-pay | Admitting: Adult Health

## 2015-09-05 VITALS — BP 112/80 | HR 86 | Temp 98.6°F | Ht 68.0 in | Wt 150.3 lb

## 2015-09-05 DIAGNOSIS — R6889 Other general symptoms and signs: Secondary | ICD-10-CM | POA: Diagnosis not present

## 2015-09-05 DIAGNOSIS — R059 Cough, unspecified: Secondary | ICD-10-CM

## 2015-09-05 DIAGNOSIS — R05 Cough: Secondary | ICD-10-CM

## 2015-09-05 MED ORDER — METHYLPREDNISOLONE 4 MG PO TBPK: ORAL_TABLET | ORAL | Status: DC

## 2015-09-05 NOTE — Patient Instructions (Addendum)
It was great meeting you today!  I have sent in a prescription for Prednisone, take this as directed and it will help with your cough.   Your symptoms are consistent with a viral illness as well as bronchitis.   Stay at home for the next 2 days and rest. Make sure to stay well hydrated.   You can take Tylenol or Ibuprofen for the body aches.   Follow up with PCP if no improvement.   Acute Bronchitis Bronchitis is inflammation of the airways that extend from the windpipe into the lungs (bronchi). The inflammation often causes mucus to develop. This leads to a cough, which is the most common symptom of bronchitis.  In acute bronchitis, the condition usually develops suddenly and goes away over time, usually in a couple weeks. Smoking, allergies, and asthma can make bronchitis worse. Repeated episodes of bronchitis may cause further lung problems.  CAUSES Acute bronchitis is most often caused by the same virus that causes a cold. The virus can spread from person to person (contagious) through coughing, sneezing, and touching contaminated objects. SIGNS AND SYMPTOMS   Cough.   Fever.   Coughing up mucus.   Body aches.   Chest congestion.   Chills.   Shortness of breath.   Sore throat.  DIAGNOSIS  Acute bronchitis is usually diagnosed through a physical exam. Your health care provider will also ask you questions about your medical history. Tests, such as chest X-rays, are sometimes done to rule out other conditions.  TREATMENT  Acute bronchitis usually goes away in a couple weeks. Oftentimes, no medical treatment is necessary. Medicines are sometimes given for relief of fever or cough. Antibiotic medicines are usually not needed but may be prescribed in certain situations. In some cases, an inhaler may be recommended to help reduce shortness of breath and control the cough. A cool mist vaporizer may also be used to help thin bronchial secretions and make it easier to clear the  chest.  HOME CARE INSTRUCTIONS  Get plenty of rest.   Drink enough fluids to keep your urine clear or pale yellow (unless you have a medical condition that requires fluid restriction). Increasing fluids may help thin your respiratory secretions (sputum) and reduce chest congestion, and it will prevent dehydration.   Take medicines only as directed by your health care provider.  If you were prescribed an antibiotic medicine, finish it all even if you start to feel better.  Avoid smoking and secondhand smoke. Exposure to cigarette smoke or irritating chemicals will make bronchitis worse. If you are a smoker, consider using nicotine gum or skin patches to help control withdrawal symptoms. Quitting smoking will help your lungs heal faster.   Reduce the chances of another bout of acute bronchitis by washing your hands frequently, avoiding people with cold symptoms, and trying not to touch your hands to your mouth, nose, or eyes.   Keep all follow-up visits as directed by your health care provider.  SEEK MEDICAL CARE IF: Your symptoms do not improve after 1 week of treatment.  SEEK IMMEDIATE MEDICAL CARE IF:  You develop an increased fever or chills.   You have chest pain.   You have severe shortness of breath.  You have bloody sputum.   You develop dehydration.  You faint or repeatedly feel like you are going to pass out.  You develop repeated vomiting.  You develop a severe headache. MAKE SURE YOU:   Understand these instructions.  Will watch your condition.  Will  get help right away if you are not doing well or get worse.   This information is not intended to replace advice given to you by your health care provider. Make sure you discuss any questions you have with your health care provider.   Document Released: 08/23/2004 Document Revised: 08/06/2014 Document Reviewed: 01/06/2013 Elsevier Interactive Patient Education [employer].

## 2015-09-05 NOTE — Progress Notes (Signed)
Subjective:    Patient ID: Chase Bennett, male    DOB: 09-30-1960, 55 y.o.   MRN: [id number]  HPI  55 year old male, patient of Dr. Alain Marion, who presents to the office today for an acute complaint of cough with flu like symptoms. His symptoms include that of dry cough, headache, joint pain and fatigue. He states " it feels like someone took my spine and twisted it" He has had his symptoms since Friday.   Went to urgent care yesterday and was given a cough syrup. He was also swabbed for influenza, patient reports this came back negative.   Has used Theraflu and the cough medication without much relief.   Review of Systems  Constitutional: Positive for activity change and fatigue. Negative for fever, chills and diaphoresis.  HENT: Positive for congestion. Negative for postnasal drip, rhinorrhea, sinus pressure and sore throat.   Respiratory: Positive for cough. Negative for shortness of breath and wheezing.   Cardiovascular: Negative.   Musculoskeletal: Negative.   Neurological: Positive for headaches.  All other systems reviewed and are negative.  No past medical history on file.  Social History   Social History  . Marital Status: Married    Spouse Name: N/A  . Number of Children: N/A  . Years of Education: N/A   Occupational History  . Not on file.   Social History Main Topics  . Smoking status: Former Research scientist (life sciences)  . Smokeless tobacco: Never Used     Comment: smokes cigar every 1-3 months  . Alcohol Use: 0.0 oz/week    0 Standard drinks or equivalent per week     Comment: 7  . Drug Use: No  . Sexual Activity: Yes   Other Topics Concern  . Not on file   Social History Narrative    Past Surgical History  Procedure Laterality Date  . Mouth surgery      Family History  Problem Relation Age of Onset  . Melanoma Father 29  . Colon cancer Neg Hx     Allergies  Allergen Reactions  . Sertraline Hcl     REACTION: nausea    Current Outpatient Prescriptions on  File Prior to Visit  Medication Sig Dispense Refill  . cholecalciferol (VITAMIN D) 1000 UNITS tablet Take 1 tablet (1,000 Units total) by mouth daily. 100 tablet 3  . Cyanocobalamin (VITAMIN B-12) 1000 MCG SUBL Place 1 tablet (1,000 mcg total) under the tongue daily. 100 tablet 3   No current facility-administered medications on file prior to visit.    BP 112/80 mmHg  Pulse 86  Temp(Src) 98.6 F (37 C) (Oral)  Ht 5\' 8"  (1.727 m)  Wt 150 lb 4.8 oz (68.176 kg)  BMI 22.86 kg/m2  SpO2 96%       Objective:   Physical Exam  Constitutional: He is oriented to person, place, and time. He appears well-developed and well-nourished. No distress.  HENT:  Head: Normocephalic and atraumatic.  Right Ear: External ear normal.  Left Ear: External ear normal.  Nose: Nose normal.  Mouth/Throat: Oropharynx is clear and moist. No oropharyngeal exudate.  Neck: Normal range of motion. Neck supple.  Cardiovascular: Normal rate, regular rhythm, normal heart sounds and intact distal pulses.  Exam reveals no gallop and no friction rub.   No murmur heard. Pulmonary/Chest: Effort normal and breath sounds normal. No respiratory distress. He has no wheezes. He has no rales. He exhibits no tenderness.  Increased expiratory phase  Musculoskeletal: Normal range of motion. He exhibits no  edema or tenderness.  No tenderness down spine  Lymphadenopathy:    He has no cervical adenopathy.  Neurological: He is alert and oriented to person, place, and time.  Skin: Skin is warm and dry. No rash noted. He is not diaphoretic. No erythema. No pallor.  Psychiatric: He has a normal mood and affect. His behavior is normal. Judgment and thought content normal.  Nursing note and vitals reviewed.     Assessment & Plan:  1. Flu-like symptoms - Ibuprofen or Tylenol for symptom management.  - Rest and stay well hydrated.  - Follow up if no improvement in the next 2-3 days  2. Cough - No wheezing or rhonchi heard. He  does not have a fever. Not concerned for pneumonia - methylPREDNISolone (MEDROL DOSEPAK) 4 MG TBPK tablet; Take as directed  Dispense: 21 tablet; Refill: 0 - Mucinex Cough - Follow up if no improvement in 2-3 days

## 2015-09-05 NOTE — Progress Notes (Signed)
Pre visit review using our clinic review tool, if applicable. No additional management support is needed unless otherwise documented below in the visit note. 

## 2016-04-09 ENCOUNTER — Telehealth: Payer: Self-pay | Admitting: Internal Medicine

## 2016-04-09 MED ORDER — SILDENAFIL CITRATE 100 MG PO TABS: 100.0000 mg | ORAL_TABLET | ORAL | 2 refills | Status: DC | PRN

## 2016-04-09 NOTE — Telephone Encounter (Signed)
C/o ED Needs Viagra - ok Sch OV

## 2016-04-13 ENCOUNTER — Telehealth: Payer: Self-pay | Admitting: Internal Medicine

## 2016-04-13 NOTE — Telephone Encounter (Signed)
Has question on sildenafil.  100mg  only comes viagra.  Is requesting 25mg  in sildenafil with enough to equal 100mg .  This would be cheaper for patient.

## 2016-04-17 MED ORDER — SILDENAFIL CITRATE 20 MG PO TABS: ORAL_TABLET | ORAL | 2 refills | Status: DC

## 2016-04-17 NOTE — Telephone Encounter (Signed)
[facility] called about this. I informed them of Dr. Camila Li OK

## 2016-04-17 NOTE — Telephone Encounter (Signed)
OK - done Generic is 20 mg Thx

## 2016-05-03 ENCOUNTER — Ambulatory Visit (INDEPENDENT_AMBULATORY_CARE_PROVIDER_SITE_OTHER): Payer: PRIVATE HEALTH INSURANCE | Admitting: Internal Medicine

## 2016-05-03 ENCOUNTER — Other Ambulatory Visit (INDEPENDENT_AMBULATORY_CARE_PROVIDER_SITE_OTHER): Payer: PRIVATE HEALTH INSURANCE

## 2016-05-03 ENCOUNTER — Encounter: Payer: Self-pay | Admitting: Internal Medicine

## 2016-05-03 VITALS — BP 135/85 | HR 65 | Ht 68.0 in | Wt 150.0 lb

## 2016-05-03 DIAGNOSIS — E785 Hyperlipidemia, unspecified: Secondary | ICD-10-CM

## 2016-05-03 DIAGNOSIS — Z0000 Encounter for general adult medical examination without abnormal findings: Secondary | ICD-10-CM

## 2016-05-03 DIAGNOSIS — R209 Unspecified disturbances of skin sensation: Secondary | ICD-10-CM | POA: Diagnosis not present

## 2016-05-03 DIAGNOSIS — M25562 Pain in left knee: Secondary | ICD-10-CM | POA: Diagnosis not present

## 2016-05-03 DIAGNOSIS — G8929 Other chronic pain: Secondary | ICD-10-CM

## 2016-05-03 DIAGNOSIS — M25561 Pain in right knee: Secondary | ICD-10-CM | POA: Insufficient documentation

## 2016-05-03 LAB — SEDIMENTATION RATE: SED RATE: 7 mm/h (ref 0–20)

## 2016-05-03 LAB — BASIC METABOLIC PANEL
BUN: 14 mg/dL (ref 6–23)
CHLORIDE: 103 meq/L (ref 96–112)
CO2: 30 mEq/L (ref 19–32)
CREATININE: 0.88 mg/dL (ref 0.40–1.50)
Calcium: 9.6 mg/dL (ref 8.4–10.5)
GFR: 95.13 mL/min (ref >60.00)
Glucose, Bld: 86 mg/dL (ref 70–99)
POTASSIUM: 5.2 meq/L — AB (ref 3.5–5.1)
Sodium: 140 mEq/L (ref 135–145)

## 2016-05-03 LAB — URINALYSIS
BILIRUBIN URINE: NEGATIVE
Hgb urine dipstick: NEGATIVE
Ketones, ur: NEGATIVE
Leukocytes, UA: NEGATIVE
NITRITE: NEGATIVE
PH: 6.5 (ref 5.0–8.0)
Specific Gravity, Urine: 1.02 (ref 1.000–1.030)
TOTAL PROTEIN, URINE-UPE24: NEGATIVE
URINE GLUCOSE: NEGATIVE
Urobilinogen, UA: 0.2 (ref 0.0–1.0)

## 2016-05-03 LAB — CBC WITH DIFFERENTIAL/PLATELET
BASOS PCT: 0.3 % (ref 0.0–3.0)
Basophils Absolute: 0 10*3/uL (ref 0.0–0.1)
EOS ABS: 0.1 10*3/uL (ref 0.0–0.7)
EOS PCT: 2.1 % (ref 0.0–5.0)
HEMATOCRIT: 45.8 % (ref 39.0–52.0)
HEMOGLOBIN: 15.7 g/dL (ref 13.0–17.0)
LYMPHS PCT: 37.7 % (ref 12.0–46.0)
Lymphs Abs: 1.9 10*3/uL (ref 0.7–4.0)
MCHC: 34.3 g/dL (ref 30.0–36.0)
MCV: 92.5 fl (ref 78.0–100.0)
Monocytes Absolute: 0.5 10*3/uL (ref 0.1–1.0)
Monocytes Relative: 10.3 % (ref 3.0–12.0)
Neutro Abs: 2.5 10*3/uL (ref 1.4–7.7)
Neutrophils Relative %: 49.6 % (ref 43.0–77.0)
Platelets: 232 10*3/uL (ref 150.0–400.0)
RBC: 4.95 Mil/uL (ref 4.22–5.81)
RDW: 13.3 % (ref 11.5–15.5)
WBC: 5 10*3/uL (ref 4.0–10.5)

## 2016-05-03 LAB — LIPID PANEL
CHOL/HDL RATIO: 4
Cholesterol: 280 mg/dL — ABNORMAL HIGH (ref 0–200)
HDL: 74.4 mg/dL (ref >39.00)
LDL CALC: 188 mg/dL — AB (ref 0–99)
NONHDL: 206.07
Triglycerides: 88 mg/dL (ref 0.0–149.0)
VLDL: 17.6 mg/dL (ref 0.0–40.0)

## 2016-05-03 LAB — HEPATIC FUNCTION PANEL
ALK PHOS: 36 U/L — AB (ref 39–117)
ALT: 15 U/L (ref 0–53)
AST: 15 U/L (ref 0–37)
Albumin: 4.3 g/dL (ref 3.5–5.2)
BILIRUBIN DIRECT: 0.2 mg/dL (ref 0.0–0.3)
BILIRUBIN TOTAL: 1.6 mg/dL — AB (ref 0.2–1.2)
TOTAL PROTEIN: 7.4 g/dL (ref 6.0–8.3)

## 2016-05-03 LAB — PSA: PSA: 1.33 ng/mL (ref 0.10–4.00)

## 2016-05-03 LAB — VITAMIN B12: Vitamin B-12: 250 pg/mL (ref 211–911)

## 2016-05-03 LAB — TSH: TSH: 1.9 u[IU]/mL (ref 0.35–4.50)

## 2016-05-03 MED ORDER — VITAMIN B-12 1000 MCG SL SUBL: 1.0000 | SUBLINGUAL_TABLET | Freq: Every day | SUBLINGUAL | 3 refills | Status: DC

## 2016-05-03 MED ORDER — GABAPENTIN 100 MG PO CAPS: 100.0000 mg | ORAL_CAPSULE | Freq: Three times a day (TID) | ORAL | 5 refills | Status: DC

## 2016-05-03 NOTE — Assessment & Plan Note (Signed)
Medial aspects B - MSK, likely due to leg position at work OTC NSAIDs, good shoes, Sports cream

## 2016-05-03 NOTE — Progress Notes (Signed)
Pre visit review using our clinic review tool, if applicable. No additional management support is needed unless otherwise documented below in the visit note. 

## 2016-05-03 NOTE — Progress Notes (Signed)
Subjective:  Patient ID: Chase Bennett, male    DOB: July 02, 1961  Age: 55 y.o. MRN: [id number]  CC: Annual Exam   HPI Kendra Krzywicki presents for a well exam. C//o B knee pain - medially. C/o feet burning/itching at night  Outpatient Medications Prior to Visit  Medication Sig Dispense Refill  . cholecalciferol (VITAMIN D) 1000 UNITS tablet Take 1 tablet (1,000 Units total) by mouth daily. 100 tablet 3  . Cyanocobalamin (VITAMIN B-12) 1000 MCG SUBL Place 1 tablet (1,000 mcg total) under the tongue daily. 100 tablet 3  . sildenafil (REVATIO) 20 MG tablet 1-5 tabs po qd prn 100 tablet 2  . sildenafil (VIAGRA) 100 MG tablet Take 1 tablet (100 mg total) by mouth as needed for erectile dysfunction. 12 tablet 2  . methylPREDNISolone (MEDROL DOSEPAK) 4 MG TBPK tablet Take as directed (Patient not taking: Reported on 05/03/2016) 21 tablet 0   No facility-administered medications prior to visit.     ROS Review of Systems  Constitutional: Negative for appetite change, fatigue and unexpected weight change.  HENT: Negative for congestion, nosebleeds, sneezing, sore throat and trouble swallowing.   Eyes: Negative for itching and visual disturbance.  Respiratory: Negative for cough.   Cardiovascular: Negative for chest pain, palpitations and leg swelling.  Gastrointestinal: Negative for abdominal distention, blood in stool, diarrhea and nausea.  Genitourinary: Positive for frequency. Negative for hematuria.  Musculoskeletal: Positive for arthralgias. Negative for back pain, gait problem, joint swelling and neck pain.  Skin: Negative for rash.  Neurological: Positive for numbness. Negative for dizziness, tremors, speech difficulty and weakness.  Psychiatric/Behavioral: Positive for sleep disturbance. Negative for agitation, dysphoric mood and suicidal ideas. The patient is not nervous/anxious.     Objective:  BP (!) 150/90   Pulse 65   Ht 5\' 8"  (1.727 m)   Wt 150 lb (68 kg)   SpO2 97%    BMI 22.81 kg/m   BP Readings from Last 3 Encounters:  05/03/16 (!) 150/90  09/05/15 112/80  01/03/15 114/71    Wt Readings from Last 3 Encounters:  05/03/16 150 lb (68 kg)  09/05/15 150 lb 4.8 oz (68.2 kg)  01/03/15 147 lb (66.7 kg)    Physical Exam  Constitutional: He is oriented to person, place, and time. He appears well-developed and well-nourished. No distress.  HENT:  Head: Normocephalic and atraumatic.  Right Ear: External ear normal.  Left Ear: External ear normal.  Nose: Nose normal.  Mouth/Throat: Oropharynx is clear and moist. No oropharyngeal exudate.  Eyes: Conjunctivae and EOM are normal. Pupils are equal, round, and reactive to light. Right eye exhibits no discharge. Left eye exhibits no discharge. No scleral icterus.  Neck: Normal range of motion. Neck supple. No JVD present. No tracheal deviation present. No thyromegaly present.  Cardiovascular: Normal rate, regular rhythm, normal heart sounds and intact distal pulses.  Exam reveals no gallop and no friction rub.   No murmur heard. Pulmonary/Chest: Effort normal and breath sounds normal. No stridor. No respiratory distress. He has no wheezes. He has no rales. He exhibits no tenderness.  Abdominal: Soft. Bowel sounds are normal. He exhibits no distension and no mass. There is no tenderness. There is no rebound and no guarding.  Genitourinary: Rectum normal and penis normal. Rectal exam shows guaiac negative stool. No penile tenderness.  Musculoskeletal: Normal range of motion. He exhibits no edema or tenderness.  Lymphadenopathy:    He has no cervical adenopathy.  Neurological: He is alert and oriented to person,  place, and time. He has normal reflexes. No cranial nerve deficit. He exhibits normal muscle tone. Coordination normal.  Skin: Skin is warm and dry. No rash noted. He is not diaphoretic. No erythema. No pallor.  Psychiatric: He has a normal mood and affect. His behavior is normal. Judgment and thought  content normal.  knees are tender medially Prostate 1+ Lab Results  Component Value Date   WBC 4.4 10/20/2014   HGB 15.7 10/20/2014   HCT 46.1 10/20/2014   PLT 231.0 10/20/2014   GLUCOSE 80 10/20/2014   CHOL 231 (H) 10/20/2014   TRIG 108.0 10/20/2014   HDL 52.30 10/20/2014   LDLDIRECT 186.0 06/03/2012   LDLCALC 157 (H) 10/20/2014   ALT 16 10/20/2014   AST 17 10/20/2014   NA 138 10/20/2014   K 5.0 10/20/2014   CL 103 10/20/2014   CREATININE 0.89 10/20/2014   BUN 8 10/20/2014   CO2 31 10/20/2014   TSH 3.21 10/20/2014   PSA 1.09 10/20/2014    No results found.  Assessment & Plan:   There are no diagnoses linked to this encounter. I have discontinued Mr. Tremel methylPREDNISolone. I am also having him maintain his cholecalciferol, Vitamin B-12, sildenafil, and sildenafil.  No orders of the defined types were placed in this encounter.    Follow-up: No Follow-up on file.  Walker Kehr, MD

## 2016-05-03 NOTE — Assessment & Plan Note (Signed)
Labs

## 2016-05-03 NOTE — Assessment & Plan Note (Signed)
We discussed age appropriate health related issues, including available/recomended screening tests and vaccinations. We discussed a need for adhering to healthy diet and exercise. Labs/EKG were reviewed/ordered. All questions were answered. He declined vaccinations

## 2016-05-03 NOTE — Assessment & Plan Note (Signed)
?  neuropathy of feet 10/17 Gabapentin

## 2016-05-04 LAB — HEPATITIS C ANTIBODY: HCV Ab: NEGATIVE

## 2017-01-29 ENCOUNTER — Telehealth: Payer: Self-pay | Admitting: Internal Medicine

## 2017-01-29 NOTE — Telephone Encounter (Signed)
Estill Bamberg from [facility] called asking that a SIG be changed for the pts sildenafil prescription. She said that his insurance will only cover 3. She can be reached at [phone].

## 2017-01-31 MED ORDER — SILDENAFIL CITRATE 100 MG PO TABS: 100.0000 mg | ORAL_TABLET | ORAL | 2 refills | Status: DC | PRN

## 2017-01-31 NOTE — Telephone Encounter (Signed)
Ok Thx 

## 2017-01-31 NOTE — Telephone Encounter (Signed)
Resent updated script...Chase Bennett

## 2017-03-04 NOTE — Progress Notes (Signed)
[facility] [address], [place] [address] Phone: [phone] Subjective:    I'm seeing this patient by the request  of:  Plotnikov, Evie Lacks, MD   CC: Bilateral knee pain  VOZ:[id number]  Chase Bennett is a 56 y.o. male coming in with complaint of bilateral knee pain. Been going on the years.  Patient does not remember any injury. Hurts more with a lot of walking. Patient continues to try to place soccer on a regular basis. Has noticed that with some cutting motion seems to give him some discomfort. Rates pain 7/10 sometimes sharpe. Does respond over-the-counter anti-inflammatories will take it but patient does not like to take it regularly     No past medical history on file. Past Surgical History:  Procedure Laterality Date  . MOUTH SURGERY     Social History   Social History  . Marital status: Married    Spouse name: N/A  . Number of children: N/A  . Years of education: N/A   Social History Main Topics  . Smoking status: Former Research scientist (life sciences)  . Smokeless tobacco: Never Used     Comment: smokes cigar every 1-3 months  . Alcohol use 0.0 oz/week     Comment: 7  . Drug use: No  . Sexual activity: Yes   Other Topics Concern  . None   Social History Narrative  . None   Allergies  Allergen Reactions  . Sertraline Hcl     REACTION: nausea   Family History  Problem Relation Age of Onset  . Melanoma Father 77  . Colon cancer Neg Hx      Past medical history, social, surgical and family history all reviewed in electronic medical record.  No pertanent information unless stated regarding to the chief complaint.   Review of Systems:Review of systems updated and as accurate as of 03/05/17  No headache, visual changes, nausea, vomiting, diarrhea, constipation, dizziness, abdominal pain, skin rash, fevers, chills, night sweats, weight loss, swollen lymph nodes, body aches, joint swelling, chest pain, shortness of breath, mood changes.  Positive muscle aches  Objective  Blood pressure 128/84, pulse 74, height 5\' 8"  (1.727 m), weight 147 lb (66.7 kg), SpO2 94 %. Systems examined below as of 03/05/17   General: No apparent distress alert and oriented x3 mood and affect normal, dressed appropriately.  HEENT: Pupils equal, extraocular movements intact  Respiratory: Patient's speak in full sentences and does not appear short of breath  Cardiovascular: No lower extremity edema, non tender, no erythema  Skin: Warm dry intact with no signs of infection or rash on extremities or on axial skeleton.  Abdomen: Soft nontender  Neuro: Cranial nerves II through XII are intact, neurovascularly intact in all extremities with 2+ DTRs and 2+ pulses.  Lymph: No lymphadenopathy of posterior or anterior cervical chain or axillae bilaterally.  Gait normal with good balance and coordination.  MSK:  Non tender with full range of motion and good stability and symmetric strength and tone of shoulders, elbows, wrist, hip, and ankles bilaterally.  Knee: Bilateral Normal to inspection with no erythema or effusion or obvious bony abnormalities. Palpation normal with no warmth, joint line tenderness, patellar tenderness, or condyle tenderness. ROM full in flexion and extension and lower leg rotation. Ligaments with solid consistent endpoints including ACL, PCL, LCL, MCL. Negative Mcmurray's, Apley's, and Thessalonian tests. Non painful patellar compression. Patellar glide without crepitus. Patellar and quadriceps tendons unremarkable. Hamstring and quadriceps strength is normal.  MSK US performed of: Bilateral This study was ordered, performed, and interpreted by Charlann Boxer D.O.  Knee: All structures visualized. Narrowing of the medial joint line. Patient does have what appears to be degenerative meniscal tears bilaterally right greater than left. Right side does have a displacement. Patellar Tendon unremarkable on long and transverse views  without effusion. LCL and MCL unremarkable on long and transverse views. No abnormality of origin of medial or lateral head of the gastrocnemius.  IMPRESSION:  Degenerative meniscus bilaterally   Impression and Recommendations:     This case required medical decision making of moderate complexity.      Note: This dictation was prepared with Dragon dictation along with smaller phrase technology. Any transcriptional errors that result from this process are unintentional.

## 2017-03-05 ENCOUNTER — Ambulatory Visit: Payer: Self-pay

## 2017-03-05 ENCOUNTER — Encounter: Payer: Self-pay | Admitting: Family Medicine

## 2017-03-05 ENCOUNTER — Ambulatory Visit (INDEPENDENT_AMBULATORY_CARE_PROVIDER_SITE_OTHER): Payer: PRIVATE HEALTH INSURANCE | Admitting: Family Medicine

## 2017-03-05 VITALS — BP 128/84 | HR 74 | Ht 68.0 in | Wt 147.0 lb

## 2017-03-05 DIAGNOSIS — G8929 Other chronic pain: Secondary | ICD-10-CM | POA: Diagnosis not present

## 2017-03-05 DIAGNOSIS — M25561 Pain in right knee: Secondary | ICD-10-CM | POA: Diagnosis not present

## 2017-03-05 DIAGNOSIS — M170 Bilateral primary osteoarthritis of knee: Secondary | ICD-10-CM | POA: Diagnosis not present

## 2017-03-05 DIAGNOSIS — M23305 Other meniscus derangements, unspecified medial meniscus, unspecified knee: Secondary | ICD-10-CM

## 2017-03-05 DIAGNOSIS — M25562 Pain in left knee: Secondary | ICD-10-CM | POA: Diagnosis not present

## 2017-03-05 MED ORDER — DICLOFENAC SODIUM 2 % TD SOLN: 2.0000 "application " | Freq: Two times a day (BID) | TRANSDERMAL | 3 refills | Status: DC

## 2017-03-05 MED ORDER — VITAMIN D (ERGOCALCIFEROL) 1.25 MG (50000 UNIT) PO CAPS: 50000.0000 [IU] | ORAL_CAPSULE | ORAL | 0 refills | Status: DC

## 2017-03-05 NOTE — Patient Instructions (Addendum)
Good to see you  Chase Bennett is your friend. Ice 20 minutes 2 times daily. Usually after activity and before bed. Exercises 3 times a week.  pennsaid pinkie amount topically 2 times daily as needed.  Once weekly vitamin D for 12 weeks for the calcium and muscle strength  Try to limit twisting other then soccer.  Keep hands within peripheral vision.  See me again in 4 weeks

## 2017-03-06 DIAGNOSIS — M170 Bilateral primary osteoarthritis of knee: Secondary | ICD-10-CM | POA: Insufficient documentation

## 2017-03-06 DIAGNOSIS — M23305 Other meniscus derangements, unspecified medial meniscus, unspecified knee: Secondary | ICD-10-CM | POA: Insufficient documentation

## 2017-03-06 NOTE — Assessment & Plan Note (Signed)
Mild overall. Medial compartment. Will follow-up 4 weeks. Plan as stated above

## 2017-03-06 NOTE — Assessment & Plan Note (Signed)
Patient does have osteophytic changes of the knees bilaterally as well as degenerative meniscal tears. Patient declined any type of injection. Topical anti-inflammatories given, we discussed icing regimen. Once weekly vitamin D. RTC in 4 weeks.

## 2017-05-21 ENCOUNTER — Encounter: Payer: Self-pay | Admitting: Internal Medicine

## 2017-05-21 ENCOUNTER — Other Ambulatory Visit (INDEPENDENT_AMBULATORY_CARE_PROVIDER_SITE_OTHER): Payer: PRIVATE HEALTH INSURANCE

## 2017-05-21 ENCOUNTER — Ambulatory Visit (INDEPENDENT_AMBULATORY_CARE_PROVIDER_SITE_OTHER): Payer: PRIVATE HEALTH INSURANCE | Admitting: Internal Medicine

## 2017-05-21 DIAGNOSIS — M25512 Pain in left shoulder: Secondary | ICD-10-CM

## 2017-05-21 DIAGNOSIS — Z0000 Encounter for general adult medical examination without abnormal findings: Secondary | ICD-10-CM | POA: Diagnosis not present

## 2017-05-21 LAB — CBC WITH DIFFERENTIAL/PLATELET
BASOS ABS: 0 10*3/uL (ref 0.0–0.1)
BASOS PCT: 0.7 % (ref 0.0–3.0)
EOS ABS: 0.1 10*3/uL (ref 0.0–0.7)
Eosinophils Relative: 2.3 % (ref 0.0–5.0)
HEMATOCRIT: 47.7 % (ref 39.0–52.0)
Hemoglobin: 15.8 g/dL (ref 13.0–17.0)
LYMPHS ABS: 1.8 10*3/uL (ref 0.7–4.0)
LYMPHS PCT: 40.3 % (ref 12.0–46.0)
MCHC: 33.2 g/dL (ref 30.0–36.0)
MCV: 96.3 fl (ref 78.0–100.0)
MONO ABS: 0.4 10*3/uL (ref 0.1–1.0)
Monocytes Relative: 10.1 % (ref 3.0–12.0)
NEUTROS ABS: 2 10*3/uL (ref 1.4–7.7)
NEUTROS PCT: 46.6 % (ref 43.0–77.0)
PLATELETS: 240 10*3/uL (ref 150.0–400.0)
RBC: 4.95 Mil/uL (ref 4.22–5.81)
RDW: 13.4 % (ref 11.5–15.5)
WBC: 4.4 10*3/uL (ref 4.0–10.5)

## 2017-05-21 LAB — HEPATIC FUNCTION PANEL
ALT: 14 U/L (ref 0–53)
AST: 17 U/L (ref 0–37)
Albumin: 4.4 g/dL (ref 3.5–5.2)
Alkaline Phosphatase: 32 U/L — ABNORMAL LOW (ref 39–117)
BILIRUBIN TOTAL: 1.3 mg/dL — AB (ref 0.2–1.2)
Bilirubin, Direct: 0.2 mg/dL (ref 0.0–0.3)
TOTAL PROTEIN: 7.5 g/dL (ref 6.0–8.3)

## 2017-05-21 LAB — URINALYSIS
BILIRUBIN URINE: NEGATIVE
Hgb urine dipstick: NEGATIVE
KETONES UR: NEGATIVE
Leukocytes, UA: NEGATIVE
Nitrite: NEGATIVE
PH: 7.5 (ref 5.0–8.0)
SPECIFIC GRAVITY, URINE: 1.015 (ref 1.000–1.030)
TOTAL PROTEIN, URINE-UPE24: NEGATIVE
URINE GLUCOSE: NEGATIVE
Urobilinogen, UA: 0.2 (ref 0.0–1.0)

## 2017-05-21 LAB — BASIC METABOLIC PANEL
BUN: 17 mg/dL (ref 6–23)
CHLORIDE: 103 meq/L (ref 96–112)
CO2: 28 meq/L (ref 19–32)
Calcium: 9.5 mg/dL (ref 8.4–10.5)
Creatinine, Ser: 0.9 mg/dL (ref 0.40–1.50)
GFR: 92.35 mL/min (ref >60.00)
Glucose, Bld: 92 mg/dL (ref 70–99)
Potassium: 4.7 mEq/L (ref 3.5–5.1)
Sodium: 138 mEq/L (ref 135–145)

## 2017-05-21 LAB — LIPID PANEL
CHOL/HDL RATIO: 3
Cholesterol: 257 mg/dL — ABNORMAL HIGH (ref 0–200)
HDL: 78.1 mg/dL (ref >39.00)
LDL Cholesterol: 158 mg/dL — ABNORMAL HIGH (ref 0–99)
NonHDL: 178.83
TRIGLYCERIDES: 104 mg/dL (ref 0.0–149.0)
VLDL: 20.8 mg/dL (ref 0.0–40.0)

## 2017-05-21 LAB — TSH: TSH: 2.44 u[IU]/mL (ref 0.35–4.50)

## 2017-05-21 NOTE — Progress Notes (Signed)
Subjective:  Patient ID: Chase Bennett, male    DOB: 08-15-1960  Age: 56 y.o. MRN: [id number]  CC: No chief complaint on file.   HPI Chase Bennett presents for well exam  Outpatient Medications Prior to Visit  Medication Sig Dispense Refill  . cholecalciferol (VITAMIN D) 1000 UNITS tablet Take 1 tablet (1,000 Units total) by mouth daily. 100 tablet 3  . Cyanocobalamin (VITAMIN B-12) 1000 MCG SUBL Place 1 tablet (1,000 mcg total) under the tongue daily. 100 tablet 3  . Diclofenac Sodium (PENNSAID) 2 % SOLN Place 2 application onto the skin 2 (two) times daily. 112 g 3  . sildenafil (REVATIO) 20 MG tablet 1-5 tabs po qd prn 100 tablet 2  . sildenafil (VIAGRA) 100 MG tablet Take 1 tablet (100 mg total) by mouth as needed for erectile dysfunction. 3 tablet 2  . Vitamin D, Ergocalciferol, (DRISDOL) 50000 units CAPS capsule Take 1 capsule (50,000 Units total) by mouth every 7 (seven) days. 12 capsule 0  . gabapentin (NEURONTIN) 100 MG capsule Take 1-2 capsules (100-200 mg total) by mouth 3 (three) times daily. 60 capsule 5   No facility-administered medications prior to visit.     ROS Review of Systems  Constitutional: Negative for appetite change, fatigue and unexpected weight change.  HENT: Negative for congestion, nosebleeds, sneezing, sore throat and trouble swallowing.   Eyes: Negative for itching and visual disturbance.  Respiratory: Negative for cough.   Cardiovascular: Negative for chest pain, palpitations and leg swelling.  Gastrointestinal: Negative for abdominal distention, blood in stool, diarrhea and nausea.  Genitourinary: Negative for frequency and hematuria.  Musculoskeletal: Positive for arthralgias. Negative for back pain, gait problem, joint swelling and neck pain.  Skin: Negative for rash.  Neurological: Negative for dizziness, tremors, speech difficulty and weakness.  Psychiatric/Behavioral: Negative for agitation, dysphoric mood and sleep disturbance. The  patient is not nervous/anxious.   occ knee pains  Objective:  BP 136/84   Pulse 62   Temp 97.8 F (36.6 C) (Oral)   Ht 5\' 8"  (1.727 m)   Wt 151 lb (68.5 kg)   SpO2 99%   BMI 22.96 kg/m   BP Readings from Last 3 Encounters:  05/21/17 136/84  03/05/17 128/84  05/03/16 135/85    Wt Readings from Last 3 Encounters:  05/21/17 151 lb (68.5 kg)  03/05/17 147 lb (66.7 kg)  05/03/16 150 lb (68 kg)    Physical Exam  Constitutional: He is oriented to person, place, and time. He appears well-developed and well-nourished. No distress.  HENT:  Head: Normocephalic and atraumatic.  Right Ear: External ear normal.  Left Ear: External ear normal.  Nose: Nose normal.  Mouth/Throat: Oropharynx is clear and moist. No oropharyngeal exudate.  Eyes: Pupils are equal, round, and reactive to light. Conjunctivae and EOM are normal. Right eye exhibits no discharge. Left eye exhibits no discharge. No scleral icterus.  Neck: Normal range of motion. Neck supple. No JVD present. No tracheal deviation present. No thyromegaly present.  Cardiovascular: Normal rate, regular rhythm, normal heart sounds and intact distal pulses.  Exam reveals no gallop and no friction rub.   No murmur heard. Pulmonary/Chest: Effort normal and breath sounds normal. No stridor. No respiratory distress. He has no wheezes. He has no rales. He exhibits no tenderness.  Abdominal: Soft. Bowel sounds are normal. He exhibits no distension and no mass. There is no tenderness. There is no rebound and no guarding.  Genitourinary: Rectum normal, prostate normal and penis normal. Rectal exam  shows guaiac negative stool. No penile tenderness.  Musculoskeletal: Normal range of motion. He exhibits no edema or tenderness.  Lymphadenopathy:    He has no cervical adenopathy.  Neurological: He is alert and oriented to person, place, and time. He has normal reflexes. No cranial nerve deficit. He exhibits normal muscle tone. Coordination normal.    Skin: Skin is warm and dry. No rash noted. He is not diaphoretic. No erythema. No pallor.  Psychiatric: He has a normal mood and affect. His behavior is normal. Judgment and thought content normal.  knees WNL L shoulder w/separation deformity Pt declined rectal exam   Procedure: EKG Indication: well exam Impression: NSR. No acute changes.  Lab Results  Component Value Date   WBC 5.0 05/03/2016   HGB 15.7 05/03/2016   HCT 45.8 05/03/2016   PLT 232.0 05/03/2016   GLUCOSE 86 05/03/2016   CHOL 280 (H) 05/03/2016   TRIG 88.0 05/03/2016   HDL 74.40 05/03/2016   LDLDIRECT 186.0 06/03/2012   LDLCALC 188 (H) 05/03/2016   ALT 15 05/03/2016   AST 15 05/03/2016   NA 140 05/03/2016   K 5.2 (H) 05/03/2016   CL 103 05/03/2016   CREATININE 0.88 05/03/2016   BUN 14 05/03/2016   CO2 30 05/03/2016   TSH 1.90 05/03/2016   PSA 1.33 05/03/2016    No results found.  Assessment & Plan:   There are no diagnoses linked to this encounter. I have discontinued Mr. Chase Bennett gabapentin. I am also having him maintain his cholecalciferol, sildenafil, Vitamin B-12, sildenafil, Diclofenac Sodium, and Vitamin D (Ergocalciferol).  No orders of the defined types were placed in this encounter.    Follow-up: No Follow-up on file.  Walker Kehr, MD

## 2017-05-21 NOTE — Assessment & Plan Note (Signed)
We discussed age appropriate health related issues, including available/recomended screening tests and vaccinations. We discussed a need for adhering to healthy diet and exercise. Labs were ordered to be later reviewed . All questions were answered.  Colon due in 2026 Dr Jacobs Refused all shots 

## 2017-11-06 ENCOUNTER — Other Ambulatory Visit: Payer: Self-pay | Admitting: Internal Medicine

## 2018-02-17 ENCOUNTER — Encounter (HOSPITAL_COMMUNITY): Payer: Self-pay

## 2018-02-17 ENCOUNTER — Emergency Department (HOSPITAL_COMMUNITY): Payer: PRIVATE HEALTH INSURANCE

## 2018-02-17 ENCOUNTER — Emergency Department (HOSPITAL_COMMUNITY)
Admission: EM | Admit: 2018-02-17 | Discharge: 2018-02-17 | Disposition: A | Discharge status: Home / Self Care | Payer: PRIVATE HEALTH INSURANCE | Attending: Emergency Medicine | Admitting: Emergency Medicine

## 2018-02-17 DIAGNOSIS — M25562 Pain in left knee: Secondary | ICD-10-CM | POA: Diagnosis present

## 2018-02-17 DIAGNOSIS — Z87891 Personal history of nicotine dependence: Secondary | ICD-10-CM | POA: Diagnosis not present

## 2018-02-17 DIAGNOSIS — M25462 Effusion, left knee: Secondary | ICD-10-CM

## 2018-02-17 DIAGNOSIS — Z79899 Other long term (current) drug therapy: Secondary | ICD-10-CM | POA: Diagnosis not present

## 2018-02-17 NOTE — Discharge Instructions (Addendum)
Call Dr. Karel Jarvis office in the morning to schedule follow up. Tell them you were seen in the Ed and referred to him

## 2018-02-17 NOTE — ED Provider Notes (Signed)
[facility] DEPT Provider Note   CSN: [id number] Arrival date & time: 02/17/18  1918     History   Chief Complaint Chief Complaint  Patient presents with  . Knee Pain    HPI Marvon Shillingburg is a 57 y.o. male who presents to the ED with left knee pain. Patient reports that the knee is swollen compared to the right. No known injury although the patient does report that he plays soccer. The pain started after he played soccer 2 weeks ago and then the swelling started a week later.   HPI  History reviewed. No pertinent past medical history.  Patient Active Problem List   Diagnosis Date Noted  . Degenerative arthritis of knee, bilateral 03/06/2017  . Degeneration disease of medial meniscus 03/06/2017  . Knee pain, bilateral 05/03/2016  . Well adult exam 06/10/2012  . Sore in nose 06/10/2012  . Dyslipidemia 04/08/2009  . ERECTILE DYSFUNCTION 04/08/2009  . PARESTHESIA 04/08/2009  . SHOULDER PAIN 02/16/2008    Past Surgical History:  Procedure Laterality Date  . MOUTH SURGERY          Home Medications    Prior to Admission medications   Medication Sig Start Date End Date Taking? Authorizing Provider  cholecalciferol (VITAMIN D) 1000 UNITS tablet Take 1 tablet (1,000 Units total) by mouth daily. 06/10/12   Plotnikov, Evie Lacks, MD  Cyanocobalamin (VITAMIN B-12) 1000 MCG SUBL Place 1 tablet (1,000 mcg total) under the tongue daily. 05/03/16   Plotnikov, Evie Lacks, MD  Diclofenac Sodium (PENNSAID) 2 % SOLN Place 2 application onto the skin 2 (two) times daily. 03/05/17   Lyndal Pulley, DO  sildenafil (REVATIO) 20 MG tablet Take 1-3 TABLET BY MOUTH AS NEEDED 11/07/17   Plotnikov, Evie Lacks, MD  sildenafil (VIAGRA) 100 MG tablet Take 1 tablet (100 mg total) by mouth as needed for erectile dysfunction. 01/31/17 01/31/18  Plotnikov, Evie Lacks, MD  Vitamin D, Ergocalciferol, (DRISDOL) 50000 units CAPS capsule Take 1 capsule (50,000 Units total) by mouth  every 7 (seven) days. 03/05/17   Lyndal Pulley, DO    Family History Family History  Problem Relation Age of Onset  . Melanoma Father 71  . Colon cancer Neg Hx     Social History Social History   Tobacco Use  . Smoking status: Former Research scientist (life sciences)  . Smokeless tobacco: Never Used  . Tobacco comment: smokes cigar every 1-3 months  Substance Use Topics  . Alcohol use: Yes    Alcohol/week: 0.0 oz    Comment: 7  . Drug use: No     Allergies   Sertraline hcl   Review of Systems Review of Systems  Musculoskeletal: Positive for arthralgias.       Left knee pain and swelling  All other systems reviewed and are negative.    Physical Exam Updated Vital Signs BP 136/86 (BP Location: Right Arm)   Pulse 82   Temp 98 F (36.7 C) (Oral)   Resp 18   Ht 5' 7.72" (1.72 m)   Wt 65 kg (143 lb 4.8 oz)   SpO2 97%   BMI 21.97 kg/m   Physical Exam  Constitutional: He appears well-developed and well-nourished. No distress.  HENT:  Head: Normocephalic.  Eyes: EOM are normal.  Neck: Neck supple.  Cardiovascular: Normal rate and intact distal pulses.  Pulmonary/Chest: Effort normal.  Musculoskeletal:       Left knee: He exhibits decreased range of motion, swelling and effusion. He exhibits no deformity  and no erythema. Tenderness found.  Neurological: He is alert.  Skin: Skin is warm and dry.  Psychiatric: He has a normal mood and affect.  Nursing note and vitals reviewed.    ED Treatments / Results  Labs (all labs ordered are listed, but only abnormal results are displayed) Labs Reviewed - No data to display  Radiology Dg Knee Complete 4 Views Left  Result Date: 02/17/2018 CLINICAL DATA:  Swelling with pain EXAM: LEFT KNEE - COMPLETE 4+ VIEW COMPARISON:  None. FINDINGS: No acute displaced fracture or malalignment. Moderate knee effusion. Minimal patellar spurring. Joint spaces otherwise maintained. IMPRESSION: 1. No acute osseous abnormality 2. Moderate knee effusion  Electronically Signed   By: Donavan Foil M.D.   On: 02/17/2018 20:26    Procedures Procedures (including critical care time)  Medications Ordered in ED Medications - No data to display   Initial Impression / Assessment and Plan / ED Course  I have reviewed the triage vital signs and the nursing notes. 57 y.o. male here with left knee pain and swelling that started 2 weeks ago and has gotten worse stable for d/c without fracture or dislocation noted on x-ray. Patient has knee brace and pain medication from his visit last week to Urgent Care. Patient is upset tonight because he states he came here for an MRI tonight and we are not doing one. Patient referred to ortho. Dr. Roxanne Mins in to examine the patient and discuss plan of care.   Final Clinical Impressions(s) / ED Diagnoses   Final diagnoses:  Knee effusion, left    ED Discharge Orders    None       Debroah Baller [place], [place] 05/69/79 4801    Delora Fuel, MD 65/53/74 [phone]

## 2018-02-17 NOTE — ED Triage Notes (Signed)
Pt complains of a left swollen ans sore knee, no injury noted, the knee is swollen compared to the other one Pt does play soccer but hasn't had an injury

## 2018-05-29 ENCOUNTER — Other Ambulatory Visit (INDEPENDENT_AMBULATORY_CARE_PROVIDER_SITE_OTHER): Payer: PRIVATE HEALTH INSURANCE

## 2018-05-29 ENCOUNTER — Ambulatory Visit (INDEPENDENT_AMBULATORY_CARE_PROVIDER_SITE_OTHER): Payer: PRIVATE HEALTH INSURANCE | Admitting: Internal Medicine

## 2018-05-29 ENCOUNTER — Encounter: Payer: Self-pay | Admitting: Internal Medicine

## 2018-05-29 VITALS — BP 132/84 | HR 61 | Temp 97.9°F | Ht 67.72 in | Wt 150.0 lb

## 2018-05-29 DIAGNOSIS — G8929 Other chronic pain: Secondary | ICD-10-CM

## 2018-05-29 DIAGNOSIS — Z0000 Encounter for general adult medical examination without abnormal findings: Secondary | ICD-10-CM

## 2018-05-29 DIAGNOSIS — M23305 Other meniscus derangements, unspecified medial meniscus, unspecified knee: Secondary | ICD-10-CM

## 2018-05-29 DIAGNOSIS — M25561 Pain in right knee: Secondary | ICD-10-CM | POA: Diagnosis not present

## 2018-05-29 DIAGNOSIS — M25562 Pain in left knee: Secondary | ICD-10-CM | POA: Diagnosis not present

## 2018-05-29 LAB — BASIC METABOLIC PANEL
BUN: 18 mg/dL (ref 6–23)
CO2: 29 meq/L (ref 19–32)
Calcium: 9.6 mg/dL (ref 8.4–10.5)
Chloride: 104 mEq/L (ref 96–112)
Creatinine, Ser: 0.95 mg/dL (ref 0.40–1.50)
GFR: 86.45 mL/min (ref >60.00)
GLUCOSE: 99 mg/dL (ref 70–99)
POTASSIUM: 5.1 meq/L (ref 3.5–5.1)
SODIUM: 139 meq/L (ref 135–145)

## 2018-05-29 LAB — LIPID PANEL
CHOL/HDL RATIO: 4
Cholesterol: 282 mg/dL — ABNORMAL HIGH (ref 0–200)
HDL: 72.9 mg/dL (ref >39.00)
LDL CALC: 193 mg/dL — AB (ref 0–99)
NONHDL: 208.83
Triglycerides: 77 mg/dL (ref 0.0–149.0)
VLDL: 15.4 mg/dL (ref 0.0–40.0)

## 2018-05-29 LAB — CBC WITH DIFFERENTIAL/PLATELET
Basophils Absolute: 0 K/uL (ref 0.0–0.1)
Basophils Relative: 0.8 % (ref 0.0–3.0)
Eosinophils Absolute: 0.1 K/uL (ref 0.0–0.7)
Eosinophils Relative: 3 % (ref 0.0–5.0)
HCT: 46.6 % (ref 39.0–52.0)
Hemoglobin: 15.9 g/dL (ref 13.0–17.0)
Lymphocytes Relative: 36.9 % (ref 12.0–46.0)
Lymphs Abs: 1.8 K/uL (ref 0.7–4.0)
MCHC: 34.2 g/dL (ref 30.0–36.0)
MCV: 95.3 fl (ref 78.0–100.0)
Monocytes Absolute: 0.5 K/uL (ref 0.1–1.0)
Monocytes Relative: 10.2 % (ref 3.0–12.0)
Neutro Abs: 2.3 K/uL (ref 1.4–7.7)
Neutrophils Relative %: 49.1 % (ref 43.0–77.0)
Platelets: 228 K/uL (ref 150.0–400.0)
RBC: 4.89 Mil/uL (ref 4.22–5.81)
RDW: 13.4 % (ref 11.5–15.5)
WBC: 4.7 K/uL (ref 4.0–10.5)

## 2018-05-29 LAB — PSA: PSA: 1.22 ng/mL (ref 0.10–4.00)

## 2018-05-29 LAB — HEPATIC FUNCTION PANEL
ALT: 18 U/L (ref 0–53)
AST: 15 U/L (ref 0–37)
Albumin: 4.7 g/dL (ref 3.5–5.2)
Alkaline Phosphatase: 35 U/L — ABNORMAL LOW (ref 39–117)
BILIRUBIN DIRECT: 0.2 mg/dL (ref 0.0–0.3)
Total Bilirubin: 1.1 mg/dL (ref 0.2–1.2)
Total Protein: 7.3 g/dL (ref 6.0–8.3)

## 2018-05-29 LAB — URINALYSIS
Bilirubin Urine: NEGATIVE
Hgb urine dipstick: NEGATIVE
Ketones, ur: NEGATIVE
LEUKOCYTES UA: NEGATIVE
NITRITE: NEGATIVE
Specific Gravity, Urine: 1.02 (ref 1.000–1.030)
Total Protein, Urine: NEGATIVE
URINE GLUCOSE: NEGATIVE
UROBILINOGEN UA: 0.2 (ref 0.0–1.0)
pH: 6.5 (ref 5.0–8.0)

## 2018-05-29 LAB — TSH: TSH: 2.84 u[IU]/mL (ref 0.35–4.50)

## 2018-05-29 MED ORDER — MELOXICAM 15 MG PO TABS: 15.0000 mg | ORAL_TABLET | Freq: Every day | ORAL | 1 refills | Status: DC

## 2018-05-29 NOTE — Assessment & Plan Note (Signed)
We discussed age appropriate health related issues, including available/recomended screening tests and vaccinations. We discussed a need for adhering to healthy diet and exercise. Labs were ordered to be later reviewed . All questions were answered.  Colon due in 2026 Dr Ardis Hughs Refused all shots

## 2018-05-29 NOTE — Patient Instructions (Signed)

## 2018-05-29 NOTE — Assessment & Plan Note (Signed)
Worse B knee pain L>R - went to [facility] in August, next appt 11/20 Meloxicam 15 mg/d

## 2018-05-29 NOTE — Assessment & Plan Note (Signed)
Notes recently worsening blurry vision, L>R. He is overdue for eye exam - requests return to [facility] - referral placed.  

## 2018-05-29 NOTE — Progress Notes (Signed)
Subjective:  Patient ID: Chase Bennett, male    DOB: Jan 08, 1961  Age: 57 y.o. MRN: [id number]  CC: No chief complaint on file.   HPI Rashid Whitenight presents for a well exam C/o B knee pain L>R - went to [facility] in August, next appt 11/20  Outpatient Medications Prior to Visit  Medication Sig Dispense Refill  . cholecalciferol (VITAMIN D) 1000 UNITS tablet Take 1 tablet (1,000 Units total) by mouth daily. 100 tablet 3  . Cyanocobalamin (VITAMIN B-12) 1000 MCG SUBL Place 1 tablet (1,000 mcg total) under the tongue daily. 100 tablet 3  . Diclofenac Sodium (PENNSAID) 2 % SOLN Place 2 application onto the skin 2 (two) times daily. 112 g 3  . sildenafil (REVATIO) 20 MG tablet Take 1-3 TABLET BY MOUTH AS NEEDED 60 tablet 3  . Vitamin D, Ergocalciferol, (DRISDOL) 50000 units CAPS capsule Take 1 capsule (50,000 Units total) by mouth every 7 (seven) days. 12 capsule 0  . sildenafil (VIAGRA) 100 MG tablet Take 1 tablet (100 mg total) by mouth as needed for erectile dysfunction. 3 tablet 2   No facility-administered medications prior to visit.     ROS: Review of Systems  Constitutional: Negative for appetite change, fatigue and unexpected weight change.  HENT: Negative for congestion, nosebleeds, sneezing, sore throat and trouble swallowing.   Eyes: Negative for itching and visual disturbance.  Respiratory: Negative for cough.   Cardiovascular: Negative for chest pain, palpitations and leg swelling.  Gastrointestinal: Negative for abdominal distention, blood in stool, diarrhea and nausea.  Genitourinary: Negative for frequency and hematuria.  Musculoskeletal: Positive for arthralgias and gait problem. Negative for back pain, joint swelling and neck pain.  Skin: Negative for rash.  Neurological: Negative for dizziness, tremors, speech difficulty and weakness.  Psychiatric/Behavioral: Negative for agitation, dysphoric mood, sleep disturbance and suicidal ideas. The patient is not  nervous/anxious.     Objective:  BP 132/84 (BP Location: Left Arm, Patient Position: Sitting, Cuff Size: Normal)   Pulse 61   Temp 97.9 F (36.6 C) (Oral)   Ht 5' 7.72" (1.72 m)   Wt 150 lb (68 kg)   SpO2 97%   BMI 23.00 kg/m   BP Readings from Last 3 Encounters:  05/29/18 132/84  02/17/18 (!) 125/91  05/21/17 136/84    Wt Readings from Last 3 Encounters:  05/29/18 150 lb (68 kg)  02/17/18 143 lb 4.8 oz (65 kg)  05/21/17 151 lb (68.5 kg)    Physical Exam  Constitutional: He is oriented to person, place, and time. He appears well-developed. No distress.  NAD  HENT:  Mouth/Throat: Oropharynx is clear and moist.  Eyes: Pupils are equal, round, and reactive to light. Conjunctivae are normal.  Neck: Normal range of motion. No JVD present. No thyromegaly present.  Cardiovascular: Normal rate, regular rhythm, normal heart sounds and intact distal pulses. Exam reveals no gallop and no friction rub.  No murmur heard. Pulmonary/Chest: Effort normal and breath sounds normal. No respiratory distress. He has no wheezes. He has no rales. He exhibits no tenderness.  Abdominal: Soft. Bowel sounds are normal. He exhibits no distension and no mass. There is no tenderness. There is no rebound and no guarding.  Musculoskeletal: Normal range of motion. He exhibits tenderness. He exhibits no edema.  Lymphadenopathy:    He has no cervical adenopathy.  Neurological: He is alert and oriented to person, place, and time. He has normal reflexes. No cranial nerve deficit. He exhibits normal muscle tone. He displays a  negative Romberg sign. Coordination and gait normal.  Skin: Skin is warm and dry. No rash noted.  Psychiatric: He has a normal mood and affect. His behavior is normal. Judgment and thought content normal.  L knee w/swelling and ROM pain medially Pt refused rectal exam   Lab Results  Component Value Date   WBC 4.4 05/21/2017   HGB 15.8 05/21/2017   HCT 47.7 05/21/2017   PLT 240.0  05/21/2017   GLUCOSE 92 05/21/2017   CHOL 257 (H) 05/21/2017   TRIG 104.0 05/21/2017   HDL 78.10 05/21/2017   LDLDIRECT 186.0 06/03/2012   LDLCALC 158 (H) 05/21/2017   ALT 14 05/21/2017   AST 17 05/21/2017   NA 138 05/21/2017   K 4.7 05/21/2017   CL 103 05/21/2017   CREATININE 0.90 05/21/2017   BUN 17 05/21/2017   CO2 28 05/21/2017   TSH 2.44 05/21/2017   PSA 1.33 05/03/2016    Dg Knee Complete 4 Views Left  Result Date: 02/17/2018 CLINICAL DATA:  Swelling with pain EXAM: LEFT KNEE - COMPLETE 4+ VIEW COMPARISON:  None. FINDINGS: No acute displaced fracture or malalignment. Moderate knee effusion. Minimal patellar spurring. Joint spaces otherwise maintained. IMPRESSION: 1. No acute osseous abnormality 2. Moderate knee effusion Electronically Signed   By: Donavan Foil M.D.   On: 02/17/2018 20:26    Assessment & Plan:   There are no diagnoses linked to this encounter.   No orders of the defined types were placed in this encounter.    Follow-up: No follow-ups on file.  Walker Kehr, MD

## 2018-06-04 ENCOUNTER — Telehealth: Payer: Self-pay | Admitting: Internal Medicine

## 2018-06-04 DIAGNOSIS — E785 Hyperlipidemia, unspecified: Secondary | ICD-10-CM

## 2018-06-04 NOTE — Telephone Encounter (Signed)
Pt would like to get the Cardiac CT calcium scoring test $150 test ordered, please advise

## 2018-06-04 NOTE — Telephone Encounter (Signed)
Please advise 

## 2018-06-05 NOTE — Telephone Encounter (Signed)
Chase Bennett, Did you order it? Thanks, AP

## 2018-06-05 NOTE — Telephone Encounter (Signed)
Test ordered

## 2018-06-05 NOTE — Addendum Note (Signed)
Addended by: Karren Cobble on: 06/05/2018 11:45 AM   Modules accepted: Orders

## 2018-06-09 ENCOUNTER — Encounter: Payer: Self-pay | Admitting: Internal Medicine

## 2018-06-24 ENCOUNTER — Inpatient Hospital Stay: Admission: RE | Admit: 2018-06-24 | Payer: PRIVATE HEALTH INSURANCE | Source: Ambulatory Visit

## 2018-07-01 ENCOUNTER — Ambulatory Visit (INDEPENDENT_AMBULATORY_CARE_PROVIDER_SITE_OTHER)
Admission: RE | Admit: 2018-07-01 | Discharge: 2018-07-01 | Disposition: A | Discharge status: Home / Self Care | Payer: Self-pay | Source: Ambulatory Visit | Attending: Internal Medicine | Admitting: Internal Medicine

## 2018-07-01 DIAGNOSIS — E785 Hyperlipidemia, unspecified: Secondary | ICD-10-CM

## 2018-07-03 ENCOUNTER — Other Ambulatory Visit: Payer: Self-pay | Admitting: Internal Medicine

## 2018-07-03 MED ORDER — ASPIRIN EC 81 MG PO TBEC: 81.0000 mg | DELAYED_RELEASE_TABLET | Freq: Every day | ORAL | 3 refills | Status: AC

## 2018-07-03 MED ORDER — RED YEAST RICE 600 MG PO CAPS: ORAL_CAPSULE | ORAL | 3 refills | Status: DC

## 2019-01-09 IMAGING — CT CT HEART SCORING
2 series · 16 of 20 positions shown, 18 images · non-contrast
Comparison: None.

Addendum:
EXAM:
OVER-READ INTERPRETATION  CT CHEST

The following report is an over-read performed by radiologist Dr.
Lucian Mihai Nela [REDACTED] on 07/01/2018. This over-read
does not include interpretation of cardiac or coronary anatomy or
pathology. The calcium score interpretation by the cardiologist is
attached.
TECHNIQUE: The patient was scanned on a Siemens Somatom 64 slice scanner. Axial
non-contrast 3 mm slices were carried out through the heart. The
data set was analyzed on a dedicated work station and scored using
the [place] method.

[Series 2: casc 3.0 i36f 2 bestdiast 66 % · axial · 0.33mm/px · z∈[-258,-153]mm · 8 of 47 slices shown, 10 images]
[im 6/47  vessel]
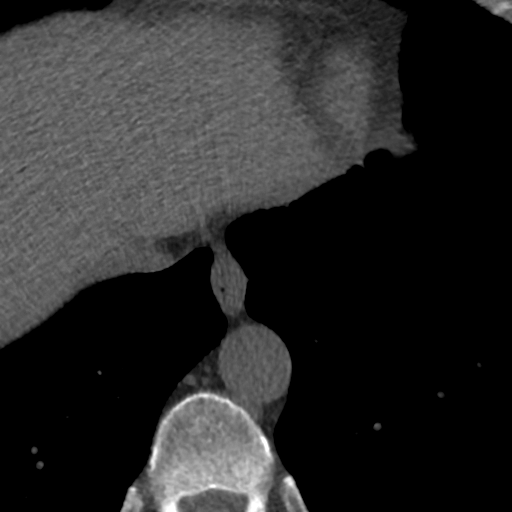
[im 6/47  lung]
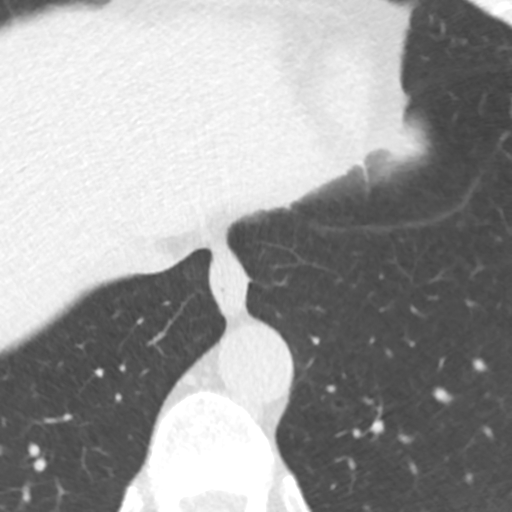
[im 11/47  vessel]
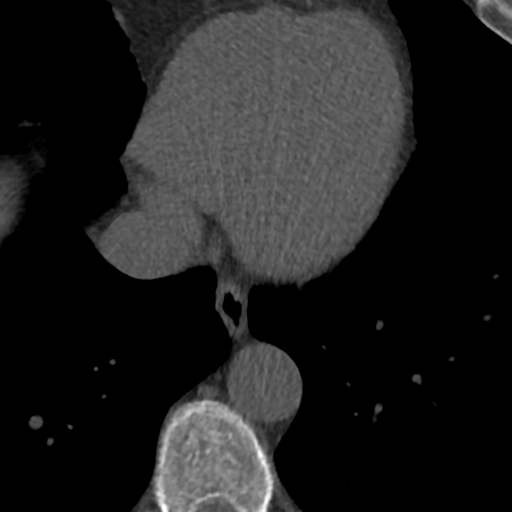
[im 16/47  vessel]
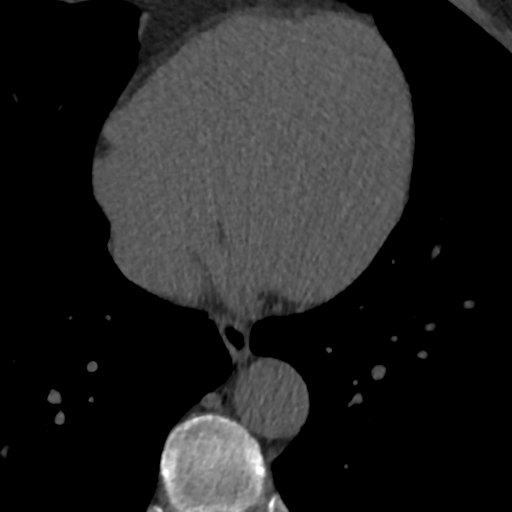
[im 21/47  vessel]
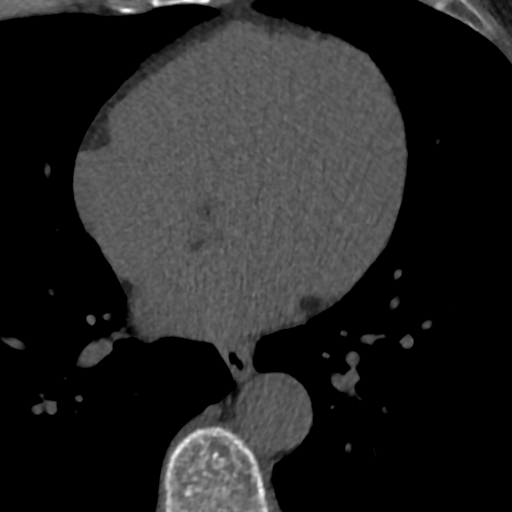
[im 26/47  vessel]
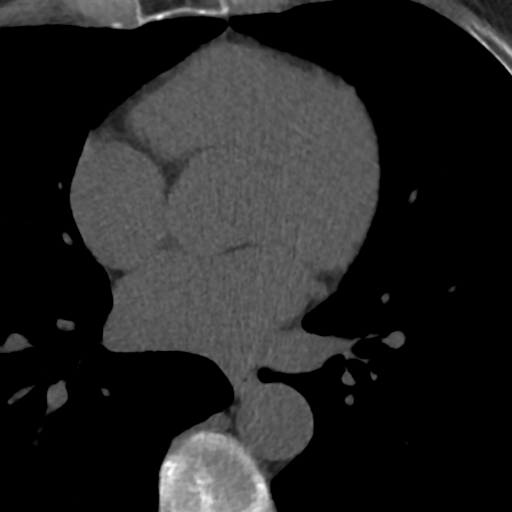
[im 26/47  lung]
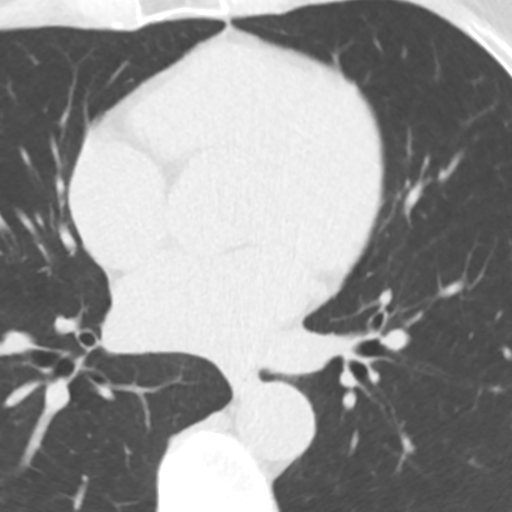
[im 31/47  vessel]
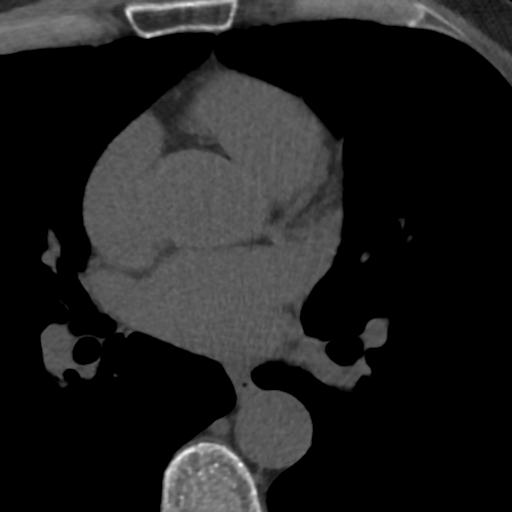
[im 36/47  vessel]
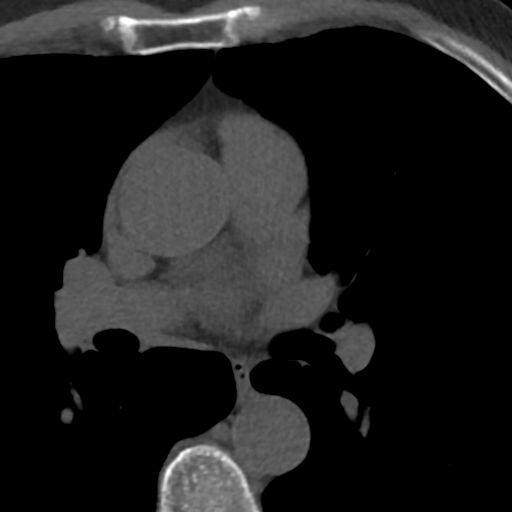
[im 41/47  vessel]
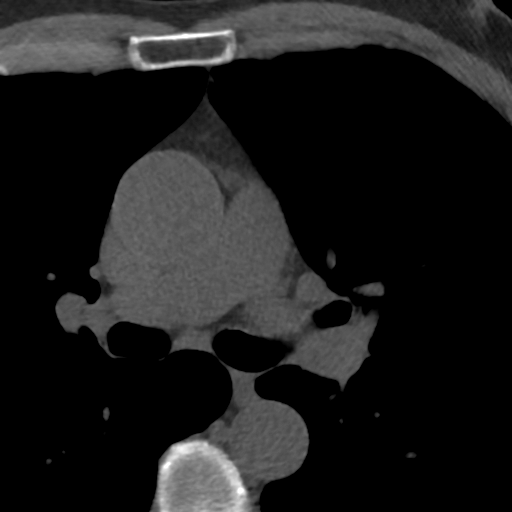

[Series 4: lung st 67 % · axial · 0.65mm/px · z∈[-258,-153]mm · 8 of 47 slices shown]
[im 6/47  lung]
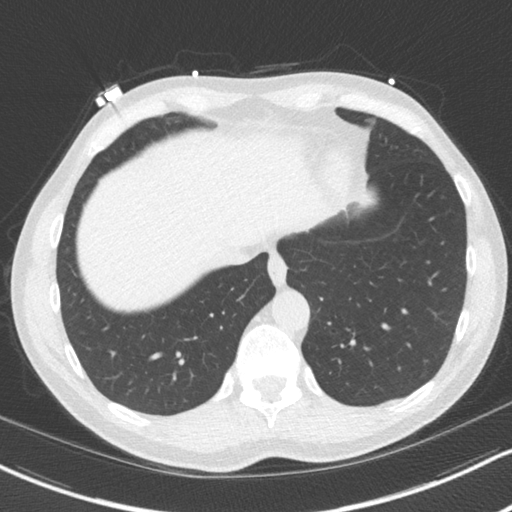
[im 11/47  lung]
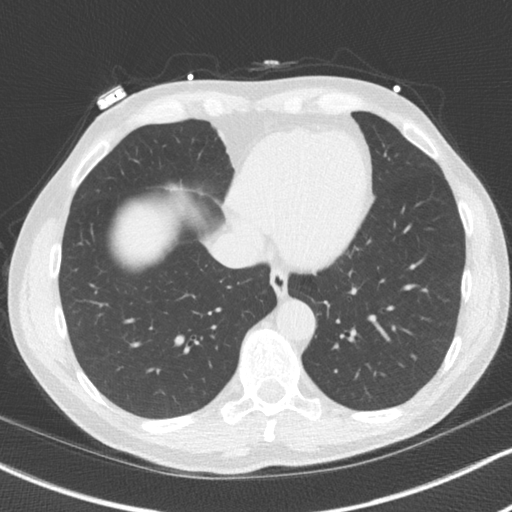
[im 16/47  lung]
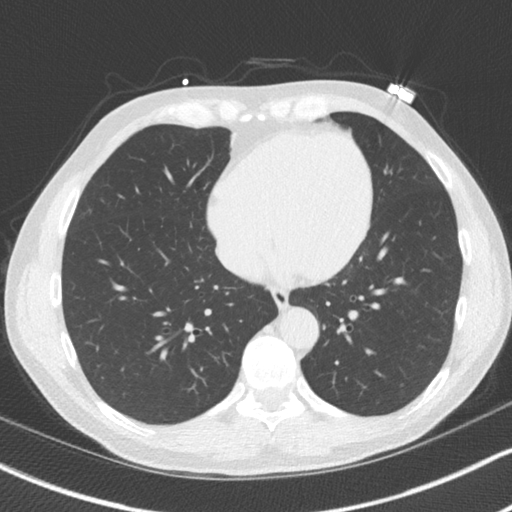
[im 21/47  lung]
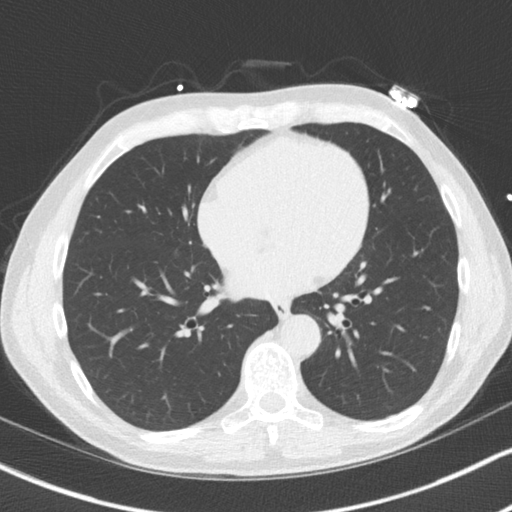
[im 26/47  lung]
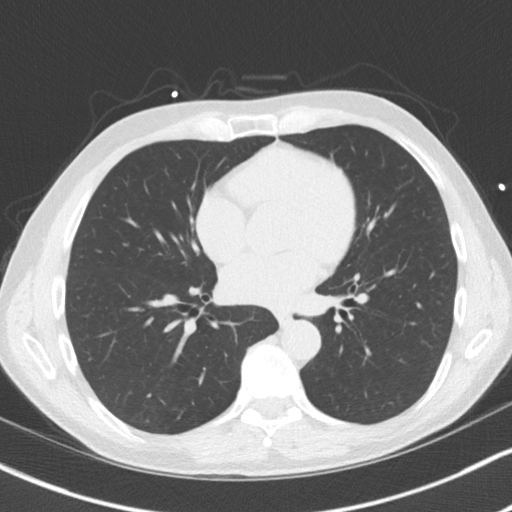
[im 31/47  lung]
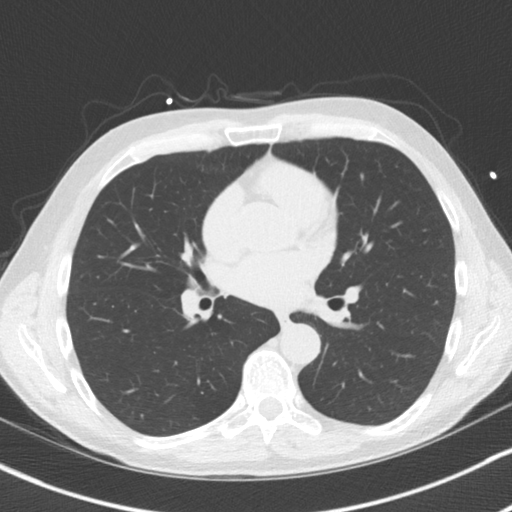
[im 36/47  lung]
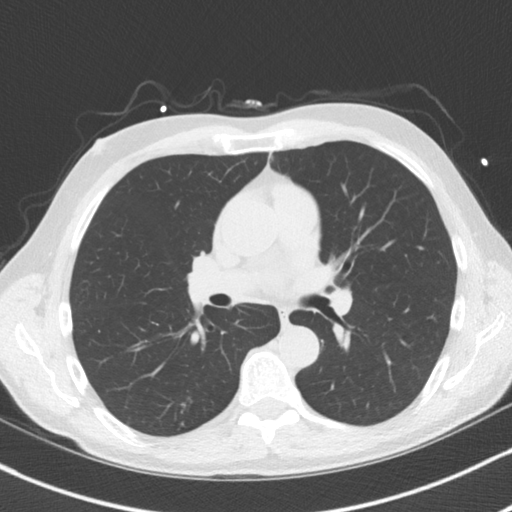
[im 41/47  lung]
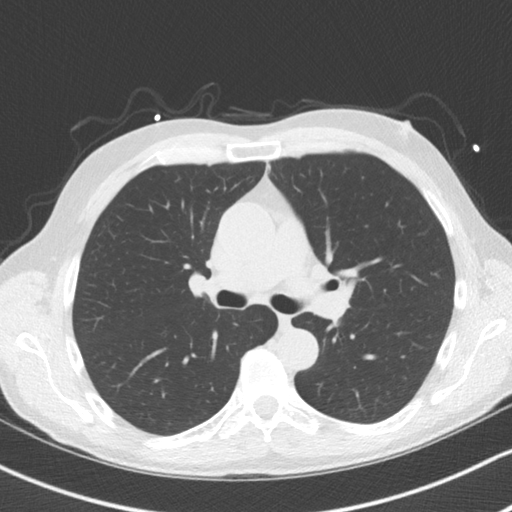

[16 of 20 positions shown; findings below may reference images not displayed]

FINDINGS: Vascular: Normal aortic caliber.

Mediastinum/Nodes: No imaged thoracic adenopathy.

Lungs/Pleura: No imaged pleural fluid. Clustered right lower lobe
pulmonary nodules of maximally 4 mm on image [DATE]. More inferior
right lower lobe pulmonary nodules are tiny and calcified.

Upper Abdomen: Normal imaged portions of the liver.

Musculoskeletal: No acute osseous abnormality.
IMPRESSION: 1.  No acute findings in the imaged extracardiac chest.
2. Clustered right lower lobe pulmonary nodules are likely post
infectious or inflammatory. Maximally 4 mm. No follow-up needed if
patient is low-risk. Non-contrast chest CT can be considered in 12
months if patient is high-risk. This recommendation follows the
consensus statement: Guidelines for Management of Incidental
Pulmonary Nodules Detected on CT Images: From the [REDACTED]AL DATA:  Risk stratification

EXAM:
Coronary Calcium Score
FINDINGS: Non-cardiac: See separate report from [REDACTED].

Ascending aorta: Normal diameter 3.6 cm

Pericardium: Normal

Coronary arteries: Calcium noted in distal LM and ostial LAD
IMPRESSION: Coronary calcium score of 8. This was 43 [REDACTED] percentile for age and
sex matched control.

Deivinson Basabe

*** End of Addendum ***

## 2019-06-02 ENCOUNTER — Other Ambulatory Visit (INDEPENDENT_AMBULATORY_CARE_PROVIDER_SITE_OTHER): Payer: PRIVATE HEALTH INSURANCE

## 2019-06-02 ENCOUNTER — Other Ambulatory Visit: Payer: Self-pay | Admitting: Internal Medicine

## 2019-06-02 ENCOUNTER — Other Ambulatory Visit: Payer: Self-pay

## 2019-06-02 ENCOUNTER — Ambulatory Visit (INDEPENDENT_AMBULATORY_CARE_PROVIDER_SITE_OTHER): Payer: PRIVATE HEALTH INSURANCE | Admitting: Internal Medicine

## 2019-06-02 ENCOUNTER — Encounter: Payer: Self-pay | Admitting: Internal Medicine

## 2019-06-02 VITALS — BP 134/84 | HR 68 | Temp 98.0°F | Ht 67.72 in | Wt 154.0 lb

## 2019-06-02 DIAGNOSIS — Z0000 Encounter for general adult medical examination without abnormal findings: Secondary | ICD-10-CM

## 2019-06-02 DIAGNOSIS — Z23 Encounter for immunization: Secondary | ICD-10-CM | POA: Diagnosis not present

## 2019-06-02 DIAGNOSIS — E785 Hyperlipidemia, unspecified: Secondary | ICD-10-CM

## 2019-06-02 LAB — HEPATIC FUNCTION PANEL
ALT: 17 U/L (ref 0–53)
AST: 17 U/L (ref 0–37)
Albumin: 4.6 g/dL (ref 3.5–5.2)
Alkaline Phosphatase: 35 U/L — ABNORMAL LOW (ref 39–117)
Bilirubin, Direct: 0.1 mg/dL (ref 0.0–0.3)
Total Bilirubin: 1 mg/dL (ref 0.2–1.2)
Total Protein: 7.2 g/dL (ref 6.0–8.3)

## 2019-06-02 LAB — URINALYSIS
Bilirubin Urine: NEGATIVE
Hgb urine dipstick: NEGATIVE
Ketones, ur: NEGATIVE
Leukocytes,Ua: NEGATIVE
Nitrite: NEGATIVE
Specific Gravity, Urine: 1.02 (ref 1.000–1.030)
Total Protein, Urine: NEGATIVE
Urine Glucose: NEGATIVE
Urobilinogen, UA: 0.2 (ref 0.0–1.0)
pH: 7.5 (ref 5.0–8.0)

## 2019-06-02 LAB — CBC WITH DIFFERENTIAL/PLATELET
Basophils Absolute: 0 10*3/uL (ref 0.0–0.1)
Basophils Relative: 0.4 % (ref 0.0–3.0)
Eosinophils Absolute: 0.1 10*3/uL (ref 0.0–0.7)
Eosinophils Relative: 2.7 % (ref 0.0–5.0)
HCT: 46.2 % (ref 39.0–52.0)
Hemoglobin: 15.2 g/dL (ref 13.0–17.0)
Lymphocytes Relative: 40.8 % (ref 12.0–46.0)
Lymphs Abs: 2.1 10*3/uL (ref 0.7–4.0)
MCHC: 32.9 g/dL (ref 30.0–36.0)
MCV: 95.9 fl (ref 78.0–100.0)
Monocytes Absolute: 0.5 10*3/uL (ref 0.1–1.0)
Monocytes Relative: 10.2 % (ref 3.0–12.0)
Neutro Abs: 2.3 10*3/uL (ref 1.4–7.7)
Neutrophils Relative %: 45.9 % (ref 43.0–77.0)
Platelets: 219 10*3/uL (ref 150.0–400.0)
RBC: 4.82 Mil/uL (ref 4.22–5.81)
RDW: 13.1 % (ref 11.5–15.5)
WBC: 5 10*3/uL (ref 4.0–10.5)

## 2019-06-02 LAB — BASIC METABOLIC PANEL
BUN: 13 mg/dL (ref 6–23)
CO2: 28 mEq/L (ref 19–32)
Calcium: 9.2 mg/dL (ref 8.4–10.5)
Chloride: 101 mEq/L (ref 96–112)
Creatinine, Ser: 0.8 mg/dL (ref 0.40–1.50)
GFR: 98.84 mL/min (ref >60.00)
Glucose, Bld: 87 mg/dL (ref 70–99)
Potassium: 4.4 mEq/L (ref 3.5–5.1)
Sodium: 137 mEq/L (ref 135–145)

## 2019-06-02 LAB — LIPID PANEL
Cholesterol: 300 mg/dL — ABNORMAL HIGH (ref 0–200)
HDL: 68 mg/dL (ref >39.00)
LDL Cholesterol: 212 mg/dL — ABNORMAL HIGH (ref 0–99)
NonHDL: 232.4
Total CHOL/HDL Ratio: 4
Triglycerides: 102 mg/dL (ref 0.0–149.0)
VLDL: 20.4 mg/dL (ref 0.0–40.0)

## 2019-06-02 LAB — PSA: PSA: 1.45 ng/mL (ref 0.10–4.00)

## 2019-06-02 LAB — TSH: TSH: 2.17 u[IU]/mL (ref 0.35–4.50)

## 2019-06-02 MED ORDER — ROSUVASTATIN CALCIUM 5 MG PO TABS: 5.0000 mg | ORAL_TABLET | Freq: Every day | ORAL | 3 refills | Status: DC

## 2019-06-02 NOTE — Assessment & Plan Note (Signed)
We discussed age appropriate health related issues, including available/recomended screening tests and vaccinations. We discussed a need for adhering to healthy diet and exercise. Labs were ordered to be later reviewed . All questions were answered. Chronic - high HDL,LDL Declined Rx. Was taking  Red rice yeast. 2019 Coronary calcium score of 8. This was 52 rd percentile for age and sex matched control. Colon due in 2026 Dr Ardis Hughs Refused all shots

## 2019-06-02 NOTE — Addendum Note (Signed)
Addended by: Karren Cobble on: 06/02/2019 08:49 AM   Modules accepted: Orders

## 2019-06-02 NOTE — Progress Notes (Signed)
Subjective:  Patient ID: Chase Bennett, male    DOB: 06-20-1961  Age: 58 y.o. MRN: [id number]  CC: No chief complaint on file.   HPI Chase Bennett presents for a well exam  Outpatient Medications Prior to Visit  Medication Sig Dispense Refill  . aspirin EC 81 MG tablet Take 1 tablet (81 mg total) by mouth daily. 100 tablet 3  . cholecalciferol (VITAMIN D) 1000 UNITS tablet Take 1 tablet (1,000 Units total) by mouth daily. 100 tablet 3  . Cyanocobalamin (VITAMIN B-12) 1000 MCG SUBL Place 1 tablet (1,000 mcg total) under the tongue daily. 100 tablet 3  . Diclofenac Sodium (PENNSAID) 2 % SOLN Place 2 application onto the skin 2 (two) times daily. 112 g 3  . meloxicam (MOBIC) 15 MG tablet Take 1 tablet (15 mg total) by mouth daily. 30 tablet 1  . Red Yeast Rice 600 MG CAPS 1 po daily 100 capsule 3  . sildenafil (REVATIO) 20 MG tablet Take 1-3 TABLET BY MOUTH AS NEEDED 60 tablet 3  . sildenafil (VIAGRA) 100 MG tablet Take 1 tablet (100 mg total) by mouth as needed for erectile dysfunction. 3 tablet 2   No facility-administered medications prior to visit.     ROS: Review of Systems  Constitutional: Negative for appetite change, fatigue and unexpected weight change.  HENT: Negative for congestion, nosebleeds, sneezing, sore throat and trouble swallowing.   Eyes: Negative for itching and visual disturbance.  Respiratory: Negative for cough.   Cardiovascular: Negative for chest pain, palpitations and leg swelling.  Gastrointestinal: Negative for abdominal distention, blood in stool, diarrhea and nausea.  Genitourinary: Negative for frequency and hematuria.  Musculoskeletal: Negative for back pain, gait problem, joint swelling and neck pain.  Skin: Negative for rash.  Neurological: Negative for dizziness, tremors, speech difficulty and weakness.  Psychiatric/Behavioral: Negative for agitation, dysphoric mood, sleep disturbance and suicidal ideas. The patient is not nervous/anxious.      Objective:  BP 134/84 (BP Location: Left Arm, Patient Position: Sitting, Cuff Size: Normal)   Pulse 68   Temp 98 F (36.7 C) (Oral)   Ht 5' 7.72" (1.72 m)   Wt 154 lb (69.9 kg)   SpO2 99%   BMI 23.61 kg/m   BP Readings from Last 3 Encounters:  06/02/19 134/84  05/29/18 132/84  02/17/18 (!) 125/91    Wt Readings from Last 3 Encounters:  06/02/19 154 lb (69.9 kg)  05/29/18 150 lb (68 kg)  02/17/18 143 lb 4.8 oz (65 kg)    Physical Exam Constitutional:      General: He is not in acute distress.    Appearance: He is well-developed.     Comments: NAD  Eyes:     Conjunctiva/sclera: Conjunctivae normal.     Pupils: Pupils are equal, round, and reactive to light.  Neck:     Musculoskeletal: Normal range of motion.     Thyroid: No thyromegaly.     Vascular: No JVD.  Cardiovascular:     Rate and Rhythm: Normal rate and regular rhythm.     Heart sounds: Normal heart sounds. No murmur. No friction rub. No gallop.   Pulmonary:     Effort: Pulmonary effort is normal. No respiratory distress.     Breath sounds: Normal breath sounds. No wheezing or rales.  Chest:     Chest wall: No tenderness.  Abdominal:     General: Bowel sounds are normal. There is no distension.     Palpations: Abdomen is soft. There  is no mass.     Tenderness: There is no abdominal tenderness. There is no guarding or rebound.  Genitourinary:    Rectum: Normal. Guaiac result negative.  Musculoskeletal: Normal range of motion.        General: No tenderness.  Lymphadenopathy:     Cervical: No cervical adenopathy.  Skin:    General: Skin is warm and dry.     Findings: No rash.  Neurological:     Mental Status: He is alert and oriented to person, place, and time.     Cranial Nerves: No cranial nerve deficit.     Motor: No abnormal muscle tone.     Coordination: Coordination normal.     Gait: Gait normal.     Deep Tendon Reflexes: Reflexes are normal and symmetric.  Psychiatric:        Behavior:  Behavior normal.        Thought Content: Thought content normal.        Judgment: Judgment normal.    prostate 1+ NT  Lab Results  Component Value Date   WBC 4.7 05/29/2018   HGB 15.9 05/29/2018   HCT 46.6 05/29/2018   PLT 228.0 05/29/2018   GLUCOSE 99 05/29/2018   CHOL 282 (H) 05/29/2018   TRIG 77.0 05/29/2018   HDL 72.90 05/29/2018   LDLDIRECT 186.0 06/03/2012   LDLCALC 193 (H) 05/29/2018   ALT 18 05/29/2018   AST 15 05/29/2018   NA 139 05/29/2018   K 5.1 05/29/2018   CL 104 05/29/2018   CREATININE 0.95 05/29/2018   BUN 18 05/29/2018   CO2 29 05/29/2018   TSH 2.84 05/29/2018   PSA 1.22 05/29/2018    Ct Cardiac Scoring  Addendum Date: 07/01/2018   ADDENDUM REPORT: 07/01/2018 09:08 CLINICAL DATA:  Risk stratification EXAM: Coronary Calcium Score TECHNIQUE: The patient was scanned on a Siemens Somatom 64 slice scanner. Axial non-contrast 3 mm slices were carried out through the heart. The data set was analyzed on a dedicated work station and scored using the [place]. FINDINGS: Non-cardiac: See separate report from [facility]. Ascending aorta: Normal diameter 3.6 cm Pericardium: Normal Coronary arteries: Calcium noted in distal LM and ostial LAD IMPRESSION: Coronary calcium score of 8. This was 2 rd percentile for age and sex matched control. Jenkins Rouge Electronically Signed   By: Jenkins Rouge M.D.   On: 07/01/2018 09:08   Result Date: 07/01/2018 EXAM: OVER-READ INTERPRETATION  CT CHEST The following report is an over-read performed by radiologist Dr. Abigail Miyamoto of [facility], [place] on 07/01/2018. This over-read does not include interpretation of cardiac or coronary anatomy or pathology. The calcium score interpretation by the cardiologist is attached. COMPARISON:  None. FINDINGS: Vascular: Normal aortic caliber. Mediastinum/Nodes: No imaged thoracic adenopathy. Lungs/Pleura: No imaged pleural fluid. Clustered right lower lobe pulmonary nodules of maximally  4 mm on image 11/3. More inferior right lower lobe pulmonary nodules are tiny and calcified. Upper Abdomen: Normal imaged portions of the liver. Musculoskeletal: No acute osseous abnormality. IMPRESSION: 1.  No acute findings in the imaged extracardiac chest. 2. Clustered right lower lobe pulmonary nodules are likely post infectious or inflammatory. Maximally 4 mm. No follow-up needed if patient is low-risk. Non-contrast chest CT can be considered in 12 months if patient is high-risk. This recommendation follows the consensus statement: Guidelines for Management of Incidental Pulmonary Nodules Detected on CT Images: From the Fleischner Society 2017; Radiology 2017; 284:228-243. Electronically Signed: By: Abigail Miyamoto M.D. On: 07/01/2018 08:53  Assessment & Plan:   Diagnoses and all orders for this visit:  Well adult exam -     TSH; Future -     Urinalysis; Future -     CBC with Differential/Platelet; Future -     Lipid panel; Future -     PSA; Future -     Basic metabolic panel; Future -     Hepatic function panel; Future     No orders of the defined types were placed in this encounter.    Follow-up: No follow-ups on file.  Walker Kehr, MD

## 2019-06-02 NOTE — Patient Instructions (Signed)
   Calcium Score  Presence of CAD (coronary artery disease)  0 No evidence of CAD   1-10 Minimal evidence of CAD  11-100 Mild evidence of CAD  101-400 Moderate evidence of CAD  Over 400 Extensive evidence of CAD

## 2019-06-02 NOTE — Assessment & Plan Note (Addendum)
Chronic - high HDL,LDL Declined Rx. Was taking  Red rice yeast.  Now lipids are worse.  Start Crestor 2019 Coronary calcium score of 8. This was 76 rd percentile for age and sex matched control.

## 2019-09-01 ENCOUNTER — Encounter: Payer: Self-pay | Admitting: Internal Medicine

## 2019-09-01 ENCOUNTER — Ambulatory Visit (INDEPENDENT_AMBULATORY_CARE_PROVIDER_SITE_OTHER): Payer: PRIVATE HEALTH INSURANCE | Admitting: Internal Medicine

## 2019-09-01 DIAGNOSIS — B349 Viral infection, unspecified: Secondary | ICD-10-CM | POA: Diagnosis not present

## 2019-09-01 MED ORDER — BENZONATATE 200 MG PO CAPS: 200.0000 mg | ORAL_CAPSULE | Freq: Three times a day (TID) | ORAL | 0 refills | Status: DC | PRN

## 2019-09-01 NOTE — Assessment & Plan Note (Signed)
Symptoms c/w viral illness - it is concerning for COVID-19 and he did have a test today - results will be back in 3-5 days Discussed at this point only symptomatic treatment is recommended - tylenol, tessalon Perles, rest, fluids. Advised him to not stay in bed too much - he needs to walk around intermittently Advised quarantining himself from other family members Advised to call if symptoms worsen or with any questions Advised him to update PCP with covid test results and how he is feeling

## 2019-09-01 NOTE — Progress Notes (Signed)
Virtual Visit via Video Note  I connected with Chase Bennett on 09/01/19 at  1:45 PM EST by a video enabled telemedicine application and verified that I am speaking with the correct person using two identifiers.   I discussed the limitations of evaluation and management by telemedicine and the availability of in person appointments. The patient expressed understanding and agreed to proceed.  Present for the visit:  Myself, Dr Billey Gosling, Chase Bennett and his wife.  The patient is currently at home and I am in the office.    No referring provider.    History of Present Illness: He is here for an acute visit for cold symptoms.  His symptoms started last night.   He is experiencing low grade fever, chills, fatigue, dry cough, body aches, joint pain, back pain and headaches.  He has tried taking theraflu.  He goes to work daily and denies any known covid at work.  He lives with his wife and father-in-law.  They are concerned about how to prevent them from getting it.   He had a covid test today at [facility]  - the results will be back in 3-5 days.    Review of Systems  Constitutional: Positive for chills, fever (low grade 99.6) and malaise/fatigue.  HENT: Negative for congestion, sinus pain and sore throat.        Smell and taste  Respiratory: Positive for cough (dry). Negative for shortness of breath and wheezing.   Cardiovascular: Negative for chest pain.  Gastrointestinal: Negative for diarrhea and nausea.  Musculoskeletal: Positive for back pain, joint pain and myalgias.  Neurological: Positive for headaches.     Social History   Socioeconomic History  . Marital status: Married    Spouse name: Not on file  . Number of children: Not on file  . Years of education: Not on file  . Highest education level: Not on file  Occupational History  . Not on file  Tobacco Use  . Smoking status: Former Research scientist (life sciences)  . Smokeless tobacco: Never Used  . Tobacco comment: smokes cigar every  1-3 months  Substance and Sexual Activity  . Alcohol use: Yes    Alcohol/week: 0.0 standard drinks    Comment: 7  . Drug use: No  . Sexual activity: Yes  Other Topics Concern  . Not on file  Social History Narrative  . Not on file   Social Determinants of Health   Financial Resource Strain:   . Difficulty of Paying Living Expenses: Not on file  Food Insecurity:   . Worried About Charity fundraiser in the Last Year: Not on file  . Ran Out of Food in the Last Year: Not on file  Transportation Needs:   . Lack of Transportation (Medical): Not on file  . Lack of Transportation (Non-Medical): Not on file  Physical Activity:   . Days of Exercise per Week: Not on file  . Minutes of Exercise per Session: Not on file  Stress:   . Feeling of Stress : Not on file  Social Connections:   . Frequency of Communication with Friends and Family: Not on file  . Frequency of Social Gatherings with Friends and Family: Not on file  . Attends Religious Services: Not on file  . Active Member of Clubs or Organizations: Not on file  . Attends Archivist Meetings: Not on file  . Marital Status: Not on file     Observations/Objective: Appears well in NAD Breathing normally w/o distress, able  to speak in full sentences, intermittent dry cough Skin appear warm and dry  Assessment and Plan:  See Problem List for Assessment and Plan of chronic medical problems.   Follow Up Instructions:    I discussed the assessment and treatment plan with the patient. The patient was provided an opportunity to ask questions and all were answered. The patient agreed with the plan and demonstrated an understanding of the instructions.   The patient was advised to call back or seek an in-person evaluation if the symptoms worsen or if the condition fails to improve as anticipated.    Binnie Rail, MD

## 2019-09-04 ENCOUNTER — Other Ambulatory Visit: Payer: Self-pay

## 2019-09-04 ENCOUNTER — Ambulatory Visit (INDEPENDENT_AMBULATORY_CARE_PROVIDER_SITE_OTHER): Payer: PRIVATE HEALTH INSURANCE | Admitting: Nurse Practitioner

## 2019-09-04 VITALS — HR 73 | Temp 98.1°F | Wt 153.0 lb

## 2019-09-04 DIAGNOSIS — R5381 Other malaise: Secondary | ICD-10-CM

## 2019-09-04 DIAGNOSIS — R5383 Other fatigue: Secondary | ICD-10-CM

## 2019-09-04 DIAGNOSIS — U071 COVID-19: Secondary | ICD-10-CM

## 2019-09-04 NOTE — Patient Instructions (Signed)
COVID-19 COVID-19 is a respiratory infection that is caused by a virus called severe acute respiratory syndrome coronavirus 2 (SARS-CoV-2). The disease is also known as coronavirus disease or novel coronavirus. In some people, the virus may not cause any symptoms. In others, it may cause a serious infection. The infection can get worse quickly and can lead to complications, such as:  Pneumonia, or infection of the lungs.  Acute respiratory distress syndrome or ARDS. This is a condition in which fluid build-up in the lungs prevents the lungs from filling with air and passing oxygen into the blood.  Acute respiratory failure. This is a condition in which there is not enough oxygen passing from the lungs to the body or when carbon dioxide is not passing from the lungs out of the body.  Sepsis or septic shock. This is a serious bodily reaction to an infection.  Blood clotting problems.  Secondary infections due to bacteria or fungus.  Organ failure. This is when your body's organs stop working. The virus that causes COVID-19 is contagious. This means that it can spread from person to person through droplets from coughs and sneezes (respiratory secretions). What are the causes? This illness is caused by a virus. You may catch the virus by:  Breathing in droplets from an infected person. Droplets can be spread by a person breathing, speaking, singing, coughing, or sneezing.  Touching something, like a table or a doorknob, that was exposed to the virus (contaminated) and then touching your mouth, nose, or eyes. What increases the risk? Risk for infection You are more likely to be infected with this virus if you:  Are within 6 feet (2 meters) of a person with COVID-19.  Provide care for or live with a person who is infected with COVID-19.  Spend time in crowded indoor spaces or live in shared housing. Risk for serious illness You are more likely to become seriously ill from the virus if  you:  Are 50 years of age or older. The higher your age, the more you are at risk for serious illness.  Live in a nursing home or long-term care facility.  Have cancer.  Have a long-term (chronic) disease such as: ? Chronic lung disease, including chronic obstructive pulmonary disease or asthma. ? A long-term disease that lowers your body's ability to fight infection (immunocompromised). ? Heart disease, including heart failure, a condition in which the arteries that lead to the heart become narrow or blocked (coronary artery disease), a disease which makes the heart muscle thick, weak, or stiff (cardiomyopathy). ? Diabetes. ? Chronic kidney disease. ? Sickle cell disease, a condition in which red blood cells have an abnormal "sickle" shape. ? Liver disease.  Are obese. What are the signs or symptoms? Symptoms of this condition can range from mild to severe. Symptoms may appear any time from 2 to 14 days after being exposed to the virus. They include:  A fever or chills.  A cough.  Difficulty breathing.  Headaches, body aches, or muscle aches.  Runny or stuffy (congested) nose.  A sore throat.  New loss of taste or smell. Some people may also have stomach problems, such as nausea, vomiting, or diarrhea. Other people may not have any symptoms of COVID-19. How is this diagnosed? This condition may be diagnosed based on:  Your signs and symptoms, especially if: ? You live in an area with a COVID-19 outbreak. ? You recently traveled to or from an area where the virus is common. ? You   provide care for or live with a person who was diagnosed with COVID-19. ? You were exposed to a person who was diagnosed with COVID-19.  A physical exam.  Lab tests, which may include: ? Taking a sample of fluid from the back of your nose and throat (nasopharyngeal fluid), your nose, or your throat using a swab. ? A sample of mucus from your lungs (sputum). ? Blood tests.  Imaging tests,  which may include, X-rays, CT scan, or ultrasound. How is this treated? At present, there is no medicine to treat COVID-19. Medicines that treat other diseases are being used on a trial basis to see if they are effective against COVID-19. Your health care provider will talk with you about ways to treat your symptoms. For most people, the infection is mild and can be managed at home with rest, fluids, and over-the-counter medicines. Treatment for a serious infection usually takes places in a hospital intensive care unit (ICU). It may include one or more of the following treatments. These treatments are given until your symptoms improve.  Receiving fluids and medicines through an IV.  Supplemental oxygen. Extra oxygen is given through a tube in the nose, a face mask, or a hood.  Positioning you to lie on your stomach (prone position). This makes it easier for oxygen to get into the lungs.  Continuous positive airway pressure (CPAP) or bi-level positive airway pressure (BPAP) machine. This treatment uses mild air pressure to keep the airways open. A tube that is connected to a motor delivers oxygen to the body.  Ventilator. This treatment moves air into and out of the lungs by using a tube that is placed in your windpipe.  Tracheostomy. This is a procedure to create a hole in the neck so that a breathing tube can be inserted.  Extracorporeal membrane oxygenation (ECMO). This procedure gives the lungs a chance to recover by taking over the functions of the heart and lungs. It supplies oxygen to the body and removes carbon dioxide. Follow these instructions at home: Lifestyle  If you are sick, stay home except to get medical care. Your health care provider will tell you how long to stay home. Call your health care provider before you go for medical care.  Rest at home as told by your health care provider.  Do not use any products that contain nicotine or tobacco, such as cigarettes,  e-cigarettes, and chewing tobacco. If you need help quitting, ask your health care provider.  Return to your normal activities as told by your health care provider. Ask your health care provider what activities are safe for you. General instructions  Take over-the-counter and prescription medicines only as told by your health care provider.  Drink enough fluid to keep your urine pale yellow.  Keep all follow-up visits as told by your health care provider. This is important. How is this prevented?  There is no vaccine to help prevent COVID-19 infection. However, there are steps you can take to protect yourself and others from this virus. To protect yourself:   Do not travel to areas where COVID-19 is a risk. The areas where COVID-19 is reported change often. To identify high-risk areas and travel restrictions, check the CDC travel website: wwwnc.cdc.gov/travel/notices  If you live in, or must travel to, an area where COVID-19 is a risk, take precautions to avoid infection. ? Stay away from people who are sick. ? Wash your hands often with soap and water for 20 seconds. If soap and water   are not available, use an alcohol-based hand sanitizer. ? Avoid touching your mouth, face, eyes, or nose. ? Avoid going out in public, follow guidance from your state and local health authorities. ? If you must go out in public, wear a cloth face covering or face mask. Make sure your mask covers your nose and mouth. ? Avoid crowded indoor spaces. Stay at least 6 feet (2 meters) away from others. ? Disinfect objects and surfaces that are frequently touched every day. This may include:  Counters and tables.  Doorknobs and light switches.  Sinks and faucets.  Electronics, such as phones, remote controls, keyboards, computers, and tablets. To protect others: If you have symptoms of COVID-19, take steps to prevent the virus from spreading to others.  If you think you have a COVID-19 infection, contact  your health care provider right away. Tell your health care team that you think you may have a COVID-19 infection.  Stay home. Leave your house only to seek medical care. Do not use public transport.  Do not travel while you are sick.  Wash your hands often with soap and water for 20 seconds. If soap and water are not available, use alcohol-based hand sanitizer.  Stay away from other members of your household. Let healthy household members care for children and pets, if possible. If you have to care for children or pets, wash your hands often and wear a mask. If possible, stay in your own room, separate from others. Use a different bathroom.  Make sure that all people in your household wash their hands well and often.  Cough or sneeze into a tissue or your sleeve or elbow. Do not cough or sneeze into your hand or into the air.  Wear a cloth face covering or face mask. Make sure your mask covers your nose and mouth. Where to find more information  Centers for Disease Control and Prevention: www.cdc.gov/coronavirus/2019-ncov/index.html  World Health Organization: www.who.int/health-topics/coronavirus Contact a health care provider if:  You live in or have traveled to an area where COVID-19 is a risk and you have symptoms of the infection.  You have had contact with someone who has COVID-19 and you have symptoms of the infection. Get help right away if:  You have trouble breathing.  You have pain or pressure in your chest.  You have confusion.  You have bluish lips and fingernails.  You have difficulty waking from sleep.  You have symptoms that get worse. These symptoms may represent a serious problem that is an emergency. Do not wait to see if the symptoms will go away. Get medical help right away. Call your local emergency services (911 in the [place] Do not drive yourself to the hospital. Let the emergency medical personnel know if you think you have  COVID-19. Summary  COVID-19 is a respiratory infection that is caused by a virus. It is also known as coronavirus disease or novel coronavirus. It can cause serious infections, such as pneumonia, acute respiratory distress syndrome, acute respiratory failure, or sepsis.  The virus that causes COVID-19 is contagious. This means that it can spread from person to person through droplets from breathing, speaking, singing, coughing, or sneezing.  You are more likely to develop a serious illness if you are 50 years of age or older, have a weak immune system, live in a nursing home, or have chronic disease.  There is no medicine to treat COVID-19. Your health care provider will talk with you about ways to treat your symptoms.    Take steps to protect yourself and others from infection. Wash your hands often and disinfect objects and surfaces that are frequently touched every day. Stay away from people who are sick and wear a mask if you are sick. This information is not intended to replace advice given to you by your health care provider. Make sure you discuss any questions you have with your health care provider. Document Revised: 05/15/2019 Document Reviewed: 08/21/2018 Elsevier Patient Education  [place] Under Monitoring Name: Chase Bennett  Location: [address] [place] [id number]   Infection Prevention Recommendations for Individuals Confirmed to have, or Being Evaluated for, 2019 Novel Coronavirus (COVID-19) Infection Who Receive Care at Home  Individuals who are confirmed to have, or are being evaluated for, COVID-19 should follow the prevention steps below until a healthcare provider or local or state health department says they can return to normal activities.  Stay home except to get medical care You should restrict activities outside your home, except for getting medical care. Do not go to work, school, or public areas, and do not use  public transportation or taxis.  Call ahead before visiting your doctor Before your medical appointment, call the healthcare provider and tell them that you have, or are being evaluated for, COVID-19 infection. This will help the healthcare provider's office take steps to keep other people from getting infected. Ask your healthcare provider to call the local or state health department.  Monitor your symptoms Seek prompt medical attention if your illness is worsening (e.g., difficulty breathing). Before going to your medical appointment, call the healthcare provider and tell them that you have, or are being evaluated for, COVID-19 infection. Ask your healthcare provider to call the local or state health department.  Wear a facemask You should wear a facemask that covers your nose and mouth when you are in the same room with other people and when you visit a healthcare provider. People who live with or visit you should also wear a facemask while they are in the same room with you.  Separate yourself from other people in your home As much as possible, you should stay in a different room from other people in your home. Also, you should use a separate bathroom, if available.  Avoid sharing household items You should not share dishes, drinking glasses, cups, eating utensils, towels, bedding, or other items with other people in your home. After using these items, you should wash them thoroughly with soap and water.  Cover your coughs and sneezes Cover your mouth and nose with a tissue when you cough or sneeze, or you can cough or sneeze into your sleeve. Throw used tissues in a lined trash can, and immediately wash your hands with soap and water for at least 20 seconds or use an alcohol-based hand rub.  Wash your [facility] your hands often and thoroughly with soap and water for at least 20 seconds. You can use an alcohol-based hand sanitizer if soap and water are not available and if your  hands are not visibly dirty. Avoid touching your eyes, nose, and mouth with unwashed hands.   Prevention Steps for Caregivers and Household Members of Individuals Confirmed to have, or Being Evaluated for, COVID-19 Infection Being Cared for in the Home  If you live with, or provide care at home for, a person confirmed to have, or being evaluated for, COVID-19 infection please follow these guidelines to prevent infection:  Follow healthcare provider's  instructions Make sure that you understand and can help the patient follow any healthcare provider instructions for all care.  Provide for the patient's basic needs You should help the patient with basic needs in the home and provide support for getting groceries, prescriptions, and other personal needs.  Monitor the patient's symptoms If they are getting sicker, call his or her medical provider and tell them that the patient has, or is being evaluated for, COVID-19 infection. This will help the healthcare provider's office take steps to keep other people from getting infected. Ask the healthcare provider to call the local or state health department.  Limit the number of people who have contact with the patient  If possible, have only one caregiver for the patient.  Other household members should stay in another home or place of residence. If this is not possible, they should stay  in another room, or be separated from the patient as much as possible. Use a separate bathroom, if available.  Restrict visitors who do not have an essential need to be in the home.  Keep older adults, very young children, and other sick people away from the patient Keep older adults, very young children, and those who have compromised immune systems or chronic health conditions away from the patient. This includes people with chronic heart, lung, or kidney conditions, diabetes, and cancer.  Ensure good ventilation Make sure that shared spaces in the home  have good air flow, such as from an air conditioner or an opened window, weather permitting.  Wash your hands often  Wash your hands often and thoroughly with soap and water for at least 20 seconds. You can use an alcohol based hand sanitizer if soap and water are not available and if your hands are not visibly dirty.  Avoid touching your eyes, nose, and mouth with unwashed hands.  Use disposable paper towels to dry your hands. If not available, use dedicated cloth towels and replace them when they become wet.  Wear a facemask and gloves  Wear a disposable facemask at all times in the room and gloves when you touch or have contact with the patient's blood, body fluids, and/or secretions or excretions, such as sweat, saliva, sputum, nasal mucus, vomit, urine, or feces.  Ensure the mask fits over your nose and mouth tightly, and do not touch it during use.  Throw out disposable facemasks and gloves after using them. Do not reuse.  Wash your hands immediately after removing your facemask and gloves.  If your personal clothing becomes contaminated, carefully remove clothing and launder. Wash your hands after handling contaminated clothing.  Place all used disposable facemasks, gloves, and other waste in a lined container before disposing them with other household waste.  Remove gloves and wash your hands immediately after handling these items.  Do not share dishes, glasses, or other household items with the patient  Avoid sharing household items. You should not share dishes, drinking glasses, cups, eating utensils, towels, bedding, or other items with a patient who is confirmed to have, or being evaluated for, COVID-19 infection.  After the person uses these items, you should wash them thoroughly with soap and water.  Wash laundry thoroughly  Immediately remove and wash clothes or bedding that have blood, body fluids, and/or secretions or excretions, such as sweat, saliva, sputum, nasal  mucus, vomit, urine, or feces, on them.  Wear gloves when handling laundry from the patient.  Read and follow directions on labels of laundry or clothing items and  detergent. In general, wash and dry with the warmest temperatures recommended on the label.  Clean all areas the individual has used often  Clean all touchable surfaces, such as counters, tabletops, doorknobs, bathroom fixtures, toilets, phones, keyboards, tablets, and bedside tables, every day. Also, clean any surfaces that may have blood, body fluids, and/or secretions or excretions on them.  Wear gloves when cleaning surfaces the patient has come in contact with.  Use a diluted bleach solution (e.g., dilute bleach with 1 part bleach and 10 parts water) or a household disinfectant with a label that says EPA-registered for coronaviruses. To make a bleach solution at home, add 1 tablespoon of bleach to 1 quart (4 cups) of water. For a larger supply, add  cup of bleach to 1 gallon (16 cups) of water.  Read labels of cleaning products and follow recommendations provided on product labels. Labels contain instructions for safe and effective use of the cleaning product including precautions you should take when applying the product, such as wearing gloves or eye protection and making sure you have good ventilation during use of the product.  Remove gloves and wash hands immediately after cleaning.  Monitor yourself for signs and symptoms of illness Caregivers and household members are considered close contacts, should monitor their health, and will be asked to limit movement outside of the home to the extent possible. Follow the monitoring steps for close contacts listed on the symptom monitoring form.   ? If you have additional questions, contact your local health department or call the epidemiologist on call at [phone] (available 24/7). ? This guidance is subject to change. For the most up-to-date guidance from [facility], please  refer to their website: [email or web address]   COVID-19 Frequently Asked Questions COVID-19 (coronavirus disease) is an infection that is caused by a large family of viruses. Some viruses cause illness in people and others cause illness in animals like camels, cats, and bats. In some cases, the viruses that cause illness in animals can spread to humans. Where did the coronavirus come from? In December 2019, [place] told the [facility]) of several cases of lung disease (human respiratory illness). These cases were linked to an open seafood and livestock market in the city of [place]. The link to the seafood and livestock market suggests that the virus may have spread from animals to humans. However, since that first outbreak in December, the virus has also been shown to spread from person to person. What is the name of the disease and the virus? Disease name Early on, this disease was called novel coronavirus. This is because scientists determined that the disease was caused by a new (novel) respiratory virus. The World Health Organization [facility]) has now named the disease COVID-19, or coronavirus disease. Virus name The virus that causes the disease is called severe acute respiratory syndrome coronavirus 2 (SARS-CoV-2). More information on disease and virus naming World Health Organization [facility]): www.who.int/emergencies/diseases/novel-coronavirus-2019/technical-guidance/naming-the-coronavirus-disease-(covid-2019)-and-the-virus-that-causes-it Who is at risk for complications from coronavirus disease? Some people may be at higher risk for complications from coronavirus disease. This includes older adults and people who have chronic diseases, such as heart disease, diabetes, and lung disease. If you are at higher risk for complications, take these extra precautions:  Stay home as much as possible.  Avoid social gatherings and  travel.  Avoid close contact with others. Stay at least 6 ft (2 m) away from others, if possible.  Wash your hands often with soap and water for at least 20 seconds.  Avoid touching your face, mouth, nose, or eyes.  Keep supplies on hand at home, such as food, medicine, and cleaning supplies.  If you must go out in public, wear a cloth face covering or face mask. Make sure your mask covers your nose and mouth. How does coronavirus disease spread? The virus that causes coronavirus disease spreads easily from person to person (is contagious). You may catch the virus by:  Breathing in droplets from an infected person. Droplets can be spread by a person breathing, speaking, singing, coughing, or sneezing.  Touching something, like a table or a doorknob, that was exposed to the virus (contaminated) and then touching your mouth, nose, or eyes. Can I get the virus from touching surfaces or objects? There is still a lot that we do not know about the virus that causes coronavirus disease. Scientists are basing a lot of information on what they know about similar viruses, such as:  Viruses cannot generally survive on surfaces for long. They need a human body (host) to survive.  It is more likely that the virus is spread by close contact with people who are sick (direct contact), such as through: ? Shaking hands or hugging. ? Breathing in respiratory droplets that travel through the air. Droplets can be spread by a person breathing, speaking, singing, coughing, or sneezing.  It is less likely that the virus is spread when a person touches a surface or object that has the virus on it (indirect contact). The virus may be able to enter the body if the person touches a surface or object and then touches his or her face, eyes, nose, or mouth. Can a person spread the virus without having symptoms of the disease? It may be possible for the virus to spread before a person has symptoms of the disease, but  this is most likely not the main way the virus is spreading. It is more likely for the virus to spread by being in close contact with people who are sick and breathing in the respiratory droplets spread by a person breathing, speaking, singing, coughing, or sneezing. What are the symptoms of coronavirus disease? Symptoms vary from person to person and can range from mild to severe. Symptoms may include:  Fever or chills.  Cough.  Difficulty breathing or feeling short of breath.  Headaches, body aches, or muscle aches.  Runny or stuffy (congested) nose.  Sore throat.  New loss of taste or smell.  Nausea, vomiting, or diarrhea. These symptoms can appear anywhere from 2 to 14 days after you have been exposed to the virus. Some people may not have any symptoms. If you develop symptoms, call your health care provider. People with severe symptoms may need hospital care. Should I be tested for this virus? Your health care provider will decide whether to test you based on your symptoms, history of exposure, and your risk factors. How does a health care provider test for this virus? Health care providers will collect samples to send for testing. Samples may include:  Taking a swab of fluid from the back of your nose and throat, your nose, or your throat.  Taking fluid from the lungs by having you cough up mucus (sputum) into a sterile cup.  Taking a blood sample. Is there a treatment or vaccine for this virus? Currently, there is no vaccine to prevent coronavirus disease. Also, there are no medicines like antibiotics or antivirals to treat the virus. A person who becomes sick is given supportive care, which  means rest and fluids. A person may also relieve his or her symptoms by using over-the-counter medicines that treat sneezing, coughing, and runny nose. These are the same medicines that a person takes for the common cold. If you develop symptoms, call your health care provider. People with  severe symptoms may need hospital care. What can I do to protect myself and my family from this virus?     You can protect yourself and your family by taking the same actions that you would take to prevent the spread of other viruses. Take the following actions:  Wash your hands often with soap and water for at least 20 seconds. If soap and water are not available, use alcohol-based hand sanitizer.  Avoid touching your face, mouth, nose, or eyes.  Cough or sneeze into a tissue, sleeve, or elbow. Do not cough or sneeze into your hand or the air. ? If you cough or sneeze into a tissue, throw it away immediately and wash your hands.  Disinfect objects and surfaces that you frequently touch every day.  Stay away from people who are sick.  Avoid going out in public, follow guidance from your state and local health authorities.  Avoid crowded indoor spaces. Stay at least 6 ft (2 m) away from others.  If you must go out in public, wear a cloth face covering or face mask. Make sure your mask covers your nose and mouth.  Stay home if you are sick, except to get medical care. Call your health care provider before you get medical care. Your health care provider will tell you how long to stay home.  Make sure your vaccines are up to date. Ask your health care provider what vaccines you need. What should I do if I need to travel? Follow travel recommendations from your local health authority, the CDC, and WHO. Travel information and advice  Centers for Disease Control and Prevention (CDC): BodyEditor.hu  World Health Organization [facility]): ThirdIncome.ca Know the risks and take action to protect your health  You are at higher risk of getting coronavirus disease if you are traveling to areas with an outbreak or if you are exposed to travelers from areas with an outbreak.  Wash your hands often and  practice good hygiene to lower the risk of catching or spreading the virus. What should I do if I am sick? General instructions to stop the spread of infection  Wash your hands often with soap and water for at least 20 seconds. If soap and water are not available, use alcohol-based hand sanitizer.  Cough or sneeze into a tissue, sleeve, or elbow. Do not cough or sneeze into your hand or the air.  If you cough or sneeze into a tissue, throw it away immediately and wash your hands.  Stay home unless you must get medical care. Call your health care provider or local health authority before you get medical care.  Avoid public areas. Do not take public transportation, if possible.  If you can, wear a mask if you must go out of the house or if you are in close contact with someone who is not sick. Make sure your mask covers your nose and mouth. Keep your home clean  Disinfect objects and surfaces that are frequently touched every day. This may include: ? Counters and tables. ? Doorknobs and light switches. ? Sinks and faucets. ? Electronics such as phones, remote controls, keyboards, computers, and tablets.  Wash dishes in hot, soapy water or use a  dishwasher. Air-dry your dishes.  Wash laundry in hot water. Prevent infecting other household members  Let healthy household members care for children and pets, if possible. If you have to care for children or pets, wash your hands often and wear a mask.  Sleep in a different bedroom or bed, if possible.  Do not share personal items, such as razors, toothbrushes, deodorant, combs, brushes, towels, and washcloths. Where to find more information Centers for Disease Control and Prevention (CDC)  Information and news updates: https://www.butler-gonzalez.com/ World Health Organization [facility])  Information and news updates: MissExecutive.com.ee  Coronavirus health topic:  https://www.castaneda.info/  Questions and answers on COVID-19: OpportunityDebt.at  Global tracker: who.sprinklr.com American Academy of Pediatrics (AAP)  Information for families: www.healthychildren.org/English/health-issues/conditions/chest-lungs/Pages/2019-Novel-Coronavirus.aspx The coronavirus situation is changing rapidly. Check your local health authority website or the CDC and [facility] websites for updates and news. When should I contact a health care provider?  Contact your health care provider if you have symptoms of an infection, such as fever or cough, and you: ? Have been near anyone who is known to have coronavirus disease. ? Have come into contact with a person who is suspected to have coronavirus disease. ? Have traveled to an area where there is an outbreak of COVID-19. When should I get emergency medical care?  Get help right away by calling your local emergency services (911 in the [place] if you have: ? Trouble breathing. ? Pain or pressure in your chest. ? Confusion. ? Blue-tinged lips and fingernails. ? Difficulty waking from sleep. ? Symptoms that get worse. Let the emergency medical personnel know if you think you have coronavirus disease. Summary  A new respiratory virus is spreading from person to person and causing COVID-19 (coronavirus disease).  The virus that causes COVID-19 appears to spread easily. It spreads from one person to another through droplets from breathing, speaking, singing, coughing, or sneezing.  Older adults and those with chronic diseases are at higher risk of disease. If you are at higher risk for complications, take extra precautions.  There is currently no vaccine to prevent coronavirus disease. There are no medicines, such as antibiotics or antivirals, to treat the virus.  You can protect yourself and your family by washing your hands often, avoiding touching your face, and covering your coughs  and sneezes. This information is not intended to replace advice given to you by your health care provider. Make sure you discuss any questions you have with your health care provider. Document Revised: 05/15/2019 Document Reviewed: 11/11/2018 Elsevier Patient Education  Clever.

## 2019-09-04 NOTE — Progress Notes (Signed)
ICD-10-CM   1. COVID-19  U07.1   2. Malaise and fatigue  R53.81    R53.83      History of Present Illness: Chase Bennett is a 59 y.o.  male  who presents to the [facility] clinic with the following symptoms.   Patient began having dry cough, chills, fatigue, and body aches on 08/31/19. Tested positive for COVID-19 on 09/01/19. Patient was seen by PCP via televisit on 2/2 and prescribed benzonatate. Patient states his symptoms have "gotten a little better" this week. Patient is most concerned about pain in his mid back bilaterally. Patient states the pain was higher on his back earlier in the week and usually occurs while having chills. Patient states he has a "mild dry cough" and "a little congestion." Denies any fever, shortness of breath, chest pain, sore throat, nausea, vomiting, diarrhea, lower extremity pain or swelling, or loss of taste or smell. Patient states he is drinking hot tea. Patient states he was up walking most of the day. Patient asks if he should take an antibiotic.   Past Medical History: Allergies: Allergies  Allergen Reactions  . Sertraline Hcl     REACTION: nausea    Current Medications:  Current Outpatient Medications:  .  benzonatate (TESSALON) 200 MG capsule, Take 1 capsule (200 mg total) by mouth 3 (three) times daily as needed for cough., Disp: 30 capsule, Rfl: 0 .  rosuvastatin (CRESTOR) 5 MG tablet, Take 1 tablet (5 mg total) by mouth daily., Disp: 90 tablet, Rfl: 3 .  cholecalciferol (VITAMIN D) 1000 UNITS tablet, Take 1 tablet (1,000 Units total) by mouth daily. (Patient not taking: Reported on 09/04/2019), Disp: 100 tablet, Rfl: 3 .  Cyanocobalamin (VITAMIN B-12) 1000 MCG SUBL, Place 1 tablet (1,000 mcg total) under the tongue daily. (Patient not taking: Reported on 09/04/2019), Disp: 100 tablet, Rfl: 3 .  Diclofenac Sodium (PENNSAID) 2 % SOLN, Place 2 application onto the skin 2 (two) times daily. (Patient not taking: Reported on 09/04/2019), Disp: 112 g,  Rfl: 3 .  Red Yeast Rice 600 MG CAPS, 1 po daily (Patient not taking: Reported on 09/04/2019), Disp: 100 capsule, Rfl: 3 .  sildenafil (REVATIO) 20 MG tablet, Take 1-3 TABLET BY MOUTH AS NEEDED (Patient not taking: Reported on 09/04/2019), Disp: 60 tablet, Rfl: 3 .  sildenafil (VIAGRA) 100 MG tablet, Take 1 tablet (100 mg total) by mouth as needed for erectile dysfunction., Disp: 3 tablet, Rfl: 2  Past Medical Problems: No past medical history on file.  Past Surgical History: Past Surgical History:  Procedure Laterality Date  . MOUTH SURGERY       Social History: Social History   Socioeconomic History  . Marital status: Married    Spouse name: Not on file  . Number of children: Not on file  . Years of education: Not on file  . Highest education level: Not on file  Occupational History  . Not on file  Tobacco Use  . Smoking status: Former Research scientist (life sciences)  . Smokeless tobacco: Never Used  . Tobacco comment: smokes cigar every 1-3 months  Substance and Sexual Activity  . Alcohol use: Yes    Alcohol/week: 0.0 standard drinks    Comment: 7  . Drug use: No  . Sexual activity: Yes  Other Topics Concern  . Not on file  Social History Narrative  . Not on file   Social Determinants of Health   Financial Resource Strain:   . Difficulty of Paying Living Expenses: Not on file  Food Insecurity:   . Worried About Charity fundraiser in the Last Year: Not on file  . Ran Out of Food in the Last Year: Not on file  Transportation Needs:   . Lack of Transportation (Medical): Not on file  . Lack of Transportation (Non-Medical): Not on file  Physical Activity:   . Days of Exercise per Week: Not on file  . Minutes of Exercise per Session: Not on file  Stress:   . Feeling of Stress : Not on file  Social Connections:   . Frequency of Communication with Friends and Family: Not on file  . Frequency of Social Gatherings with Friends and Family: Not on file  . Attends Religious Services: Not on file   . Active Member of Clubs or Organizations: Not on file  . Attends Archivist Meetings: Not on file  . Marital Status: Not on file  Intimate Partner Violence:   . Fear of Current or Ex-Partner: Not on file  . Emotionally Abused: Not on file  . Physically Abused: Not on file  . Sexually Abused: Not on file    Family History: Family History  Problem Relation Age of Onset  . Melanoma Father 31  . Colon cancer Neg Hx     Review of Systems: Pertinent negatives listed in HPI  Physical Exam: Vital Signs Pulse 73   Temp 98.1 F (36.7 C)   Wt 153 lb (69.4 kg)   SpO2 98%   BMI 23.46 kg/m  General:alert, cooperative, appears stated age and no distress HEENT: normal Chest: symmetrical rise and fall Cardiac: RRR, +2 radial pulses bilaterally, +2 pedal pulses bilaterally Abdomen: soft, non-distended, non-tender Musculoskeletal: MAEx4 Neuro: A&Ox3 Extremities: no lower extremity swelling or tenderness Skin: warm, dry  Assessment/Plan:  1. COVID-19 Patient's history and exam are very reassuring. Explained to patient the nature of Viral vs. Bacterial infection and that antibiotics are not indicated at this time.  -Rest, but continue to ambulate frequently -Stay well hydrated -Patient encouraged to self isolate  2. Malaise Patient's back pain is suggestive of body aches.  -Tylenol prn   -The patient was given clear instructions to go to ER or return to medical center if symptoms do not improve, worsen or new problems develop. The patient verbalized understanding.  -Follow up with PCP as needed     Electronically signed by: Alfredo Batty, NP 09/04/2019 10:42 PM

## 2019-09-10 ENCOUNTER — Encounter: Payer: Self-pay | Admitting: Internal Medicine

## 2019-09-10 ENCOUNTER — Ambulatory Visit (INDEPENDENT_AMBULATORY_CARE_PROVIDER_SITE_OTHER): Payer: PRIVATE HEALTH INSURANCE | Admitting: Internal Medicine

## 2019-09-10 DIAGNOSIS — R05 Cough: Secondary | ICD-10-CM | POA: Insufficient documentation

## 2019-09-10 DIAGNOSIS — U071 COVID-19: Secondary | ICD-10-CM

## 2019-09-10 DIAGNOSIS — R059 Cough, unspecified: Secondary | ICD-10-CM | POA: Insufficient documentation

## 2019-09-10 MED ORDER — PROMETHAZINE-CODEINE 6.25-10 MG/5ML PO SYRP: 5.0000 mL | ORAL_SOLUTION | ORAL | 0 refills | Status: DC | PRN

## 2019-09-10 MED ORDER — AZITHROMYCIN 250 MG PO TABS: ORAL_TABLET | ORAL | 0 refills | Status: DC

## 2019-09-10 MED ORDER — METHYLPREDNISOLONE 4 MG PO TBPK: ORAL_TABLET | ORAL | 0 refills | Status: DC

## 2019-09-10 NOTE — Progress Notes (Signed)
Virtual Visit via Video Note  I connected with Chase Bennett on 09/10/19 at  2:40 PM EST by a video enabled telemedicine application and verified that I am speaking with the correct person using two identifiers.   I discussed the limitations of evaluation and management by telemedicine and the availability of in person appointments. The patient expressed understanding and agreed to proceed.  History of Present Illness: The patient has been sick with fever, fatigue, cough, change of sense of taste of 10 days duration.  Now has a second wave of low-grade fever.  Not feeling better.  There has been  runny nose, cough, some chest pain, no shortness of breath, no abdominal pain, no diarrhea. Observations/Objective: The patient appears to be in no acute distress, looks tired  Assessment and Plan:  See my Assessment and Plan. Follow Up Instructions:    I discussed the assessment and treatment plan with the patient. The patient was provided an opportunity to ask questions and all were answered. The patient agreed with the plan and demonstrated an understanding of the instructions.   The patient was advised to call back or seek an in-person evaluation if the symptoms worsen or if the condition fails to improve as anticipated.  I provided face-to-face time during this encounter. We were at different locations.   Walker Kehr, MD

## 2019-09-10 NOTE — Assessment & Plan Note (Signed)
Second wave of fever.  Start Medrol Dosepak.  Z-Pak.  Promethazine with codeine Chest x-ray if worse Go to ER if dyspnea

## 2019-09-10 NOTE — Assessment & Plan Note (Signed)
Worse.  There is no shortness of breath yet.  Low-grade fever Medrol Dosepak.  Z-Pak.  Promethazine with codeine Chest x-ray if worse Go to ER if dyspnea

## 2019-09-14 ENCOUNTER — Ambulatory Visit (INDEPENDENT_AMBULATORY_CARE_PROVIDER_SITE_OTHER): Payer: PRIVATE HEALTH INSURANCE | Admitting: Internal Medicine

## 2019-09-14 ENCOUNTER — Encounter (INDEPENDENT_AMBULATORY_CARE_PROVIDER_SITE_OTHER): Payer: Self-pay

## 2019-09-14 DIAGNOSIS — U071 COVID-19: Secondary | ICD-10-CM | POA: Diagnosis not present

## 2019-09-14 DIAGNOSIS — R05 Cough: Secondary | ICD-10-CM | POA: Diagnosis not present

## 2019-09-14 DIAGNOSIS — R059 Cough, unspecified: Secondary | ICD-10-CM

## 2019-09-18 ENCOUNTER — Encounter: Payer: Self-pay | Admitting: Internal Medicine

## 2019-09-18 NOTE — Assessment & Plan Note (Signed)
Stay at home while sick.  Finish the antibiotic and Medrol Dosepak for bronchitis

## 2019-09-18 NOTE — Progress Notes (Signed)
Virtual Visit via Video Note  I connected with Chase Bennett on 09/18/19 at 10:40 AM EST by a video enabled telemedicine application and verified that I am speaking with the correct person using two identifiers.   I discussed the limitations of evaluation and management by telemedicine and the availability of in person appointments. The patient expressed understanding and agreed to proceed.  History of Present Illness: We need to follow-up on COVID-19.  He continues to cough, still feeling weak and tired.  He is not short of breath.  There has been no diarrhea, skin rashes.   Observations/Objective: The patient appears to be in no acute distress.  Assessment and Plan:  See my Assessment and Plan. Follow Up Instructions:    I discussed the assessment and treatment plan with the patient. The patient was provided an opportunity to ask questions and all were answered. The patient agreed with the plan and demonstrated an understanding of the instructions.   The patient was advised to call back or seek an in-person evaluation if the symptoms worsen or if the condition fails to improve as anticipated.  I provided face-to-face time during this encounter. We were at different locations.   Walker Kehr, MD

## 2019-09-18 NOTE — Assessment & Plan Note (Signed)
Promethazine with codeine cough syrup as needed

## 2019-09-20 ENCOUNTER — Encounter (INDEPENDENT_AMBULATORY_CARE_PROVIDER_SITE_OTHER): Payer: Self-pay

## 2020-02-02 ENCOUNTER — Telehealth: Payer: Self-pay | Admitting: Internal Medicine

## 2020-02-02 MED ORDER — SILDENAFIL CITRATE 20 MG PO TABS: ORAL_TABLET | ORAL | 3 refills | Status: DC

## 2020-02-02 NOTE — Telephone Encounter (Signed)
    1.Medication Requested: sildenafil (REVATIO) 20 MG tablet  2. Pharmacy ([place], [place], [place]):  [place], [place] - [phone]  3. On Med List: yes  4. Last Visit with PCP: 09/14/19  5. Next visit date with PCP: n/a   Agent: Please be advised that RX refills may take up to 3 business days. We ask that you follow-up with your pharmacy.

## 2020-02-02 NOTE — Telephone Encounter (Signed)
Reviewed chart pt is up-to-date sent refills to [facility].../[facility]  

## 2020-09-28 ENCOUNTER — Encounter: Payer: Self-pay | Admitting: Internal Medicine

## 2020-09-28 ENCOUNTER — Ambulatory Visit (INDEPENDENT_AMBULATORY_CARE_PROVIDER_SITE_OTHER): Payer: PRIVATE HEALTH INSURANCE | Admitting: Internal Medicine

## 2020-09-28 ENCOUNTER — Other Ambulatory Visit: Payer: Self-pay

## 2020-09-28 VITALS — BP 138/80 | HR 66 | Temp 98.4°F | Ht 67.5 in | Wt 154.2 lb

## 2020-09-28 DIAGNOSIS — Z0000 Encounter for general adult medical examination without abnormal findings: Secondary | ICD-10-CM | POA: Diagnosis not present

## 2020-09-28 DIAGNOSIS — E785 Hyperlipidemia, unspecified: Secondary | ICD-10-CM | POA: Diagnosis not present

## 2020-09-28 DIAGNOSIS — Z23 Encounter for immunization: Secondary | ICD-10-CM | POA: Diagnosis not present

## 2020-09-28 LAB — LIPID PANEL
Cholesterol: 243 mg/dL — ABNORMAL HIGH (ref 0–200)
HDL: 87.4 mg/dL (ref >39.00)
LDL Cholesterol: 131 mg/dL — ABNORMAL HIGH (ref 0–99)
NonHDL: 155.38
Total CHOL/HDL Ratio: 3
Triglycerides: 120 mg/dL (ref 0.0–149.0)
VLDL: 24 mg/dL (ref 0.0–40.0)

## 2020-09-28 LAB — CBC WITH DIFFERENTIAL/PLATELET
Basophils Absolute: 0 10*3/uL (ref 0.0–0.1)
Basophils Relative: 0.5 % (ref 0.0–3.0)
Eosinophils Absolute: 0.2 10*3/uL (ref 0.0–0.7)
Eosinophils Relative: 4.8 % (ref 0.0–5.0)
HCT: 47.1 % (ref 39.0–52.0)
Hemoglobin: 15.7 g/dL (ref 13.0–17.0)
Lymphocytes Relative: 37.1 % (ref 12.0–46.0)
Lymphs Abs: 1.9 10*3/uL (ref 0.7–4.0)
MCHC: 33.3 g/dL (ref 30.0–36.0)
MCV: 95.3 fl (ref 78.0–100.0)
Monocytes Absolute: 0.5 10*3/uL (ref 0.1–1.0)
Monocytes Relative: 9.5 % (ref 3.0–12.0)
Neutro Abs: 2.5 10*3/uL (ref 1.4–7.7)
Neutrophils Relative %: 48.1 % (ref 43.0–77.0)
Platelets: 221 10*3/uL (ref 150.0–400.0)
RBC: 4.95 Mil/uL (ref 4.22–5.81)
RDW: 13.5 % (ref 11.5–15.5)
WBC: 5.2 10*3/uL (ref 4.0–10.5)

## 2020-09-28 LAB — URINALYSIS
Bilirubin Urine: NEGATIVE
Hgb urine dipstick: NEGATIVE
Ketones, ur: NEGATIVE
Leukocytes,Ua: NEGATIVE
Nitrite: NEGATIVE
Specific Gravity, Urine: 1.02 (ref 1.000–1.030)
Total Protein, Urine: NEGATIVE
Urine Glucose: NEGATIVE
Urobilinogen, UA: 0.2 (ref 0.0–1.0)
pH: 7 (ref 5.0–8.0)

## 2020-09-28 LAB — COMPREHENSIVE METABOLIC PANEL
ALT: 28 U/L (ref 0–53)
AST: 22 U/L (ref 0–37)
Albumin: 4.5 g/dL (ref 3.5–5.2)
Alkaline Phosphatase: 35 U/L — ABNORMAL LOW (ref 39–117)
BUN: 17 mg/dL (ref 6–23)
CO2: 29 mEq/L (ref 19–32)
Calcium: 9.4 mg/dL (ref 8.4–10.5)
Chloride: 102 mEq/L (ref 96–112)
Creatinine, Ser: 0.88 mg/dL (ref 0.40–1.50)
GFR: 93.38 mL/min (ref >60.00)
Glucose, Bld: 92 mg/dL (ref 70–99)
Potassium: 4.8 mEq/L (ref 3.5–5.1)
Sodium: 136 mEq/L (ref 135–145)
Total Bilirubin: 1.2 mg/dL (ref 0.2–1.2)
Total Protein: 7.5 g/dL (ref 6.0–8.3)

## 2020-09-28 MED ORDER — SILDENAFIL CITRATE 20 MG PO TABS: ORAL_TABLET | ORAL | 3 refills | Status: DC

## 2020-09-28 MED ORDER — ROSUVASTATIN CALCIUM 5 MG PO TABS: 5.0000 mg | ORAL_TABLET | Freq: Every day | ORAL | 3 refills | Status: DC

## 2020-09-28 NOTE — Addendum Note (Signed)
Addended by: Earnstine Regal on: 09/28/2020 09:31 AM   Modules accepted: Orders

## 2020-09-28 NOTE — Assessment & Plan Note (Addendum)
  We discussed age appropriate health related issues, including available/recomended screening tests and vaccinations. Labs were ordered to be later reviewed . All questions were answered. We discussed one or more of the following - seat belt use, use of sunscreen/sun exposure exercise, safe sex, fall risk reduction, second hand smoke exposure, firearm use and storage, seat belt use, a need for adhering to healthy diet and exercise. Labs were ordered.  All questions were answered. Chronic - high HDL,LDL Declined Rx. Was taking  Red rice yeast. 2019 Coronary calcium score of 8. This was 43 rd percentile for age and sex matched control. Colon due in 2026 Dr Ardis Hughs tDAP Shingrix

## 2020-09-28 NOTE — Patient Instructions (Signed)
Saw [facility] for prostate

## 2020-09-28 NOTE — Progress Notes (Signed)
Subjective:  Patient ID: Chase Bennett, male    DOB: 05-03-1961  Age: 60 y.o. MRN: [id number]  CC: Annual Exam   HPI Chase Bennett presents for a well exam  Outpatient Medications Prior to Visit  Medication Sig Dispense Refill  . cholecalciferol (VITAMIN D) 1000 UNITS tablet Take 1 tablet (1,000 Units total) by mouth daily. 100 tablet 3  . Cyanocobalamin (VITAMIN B-12) 1000 MCG SUBL Place 1 tablet (1,000 mcg total) under the tongue daily. 100 tablet 3  . rosuvastatin (CRESTOR) 5 MG tablet Take 1 tablet (5 mg total) by mouth daily. 90 tablet 3  . sildenafil (REVATIO) 20 MG tablet Take 1-3 TABLET BY MOUTH AS NEEDED 60 tablet 3  . sildenafil (VIAGRA) 100 MG tablet Take 1 tablet (100 mg total) by mouth as needed for erectile dysfunction. 3 tablet 2  . azithromycin (ZITHROMAX Z-PAK) 250 MG tablet As directed (Patient not taking: Reported on 09/28/2020) 6 tablet 0  . benzonatate (TESSALON) 200 MG capsule Take 1 capsule (200 mg total) by mouth 3 (three) times daily as needed for cough. (Patient not taking: Reported on 09/28/2020) 30 capsule 0  . Diclofenac Sodium (PENNSAID) 2 % SOLN Place 2 application onto the skin 2 (two) times daily. (Patient not taking: No sig reported) 112 g 3  . methylPREDNISolone (MEDROL DOSEPAK) 4 MG TBPK tablet As directed (Patient not taking: Reported on 09/28/2020) 21 tablet 0  . promethazine-codeine (PHENERGAN WITH CODEINE) 6.25-10 MG/5ML syrup Take 5 mLs by mouth every 4 (four) hours as needed. (Patient not taking: Reported on 09/28/2020) 300 mL 0  . Red Yeast Rice 600 MG CAPS 1 po daily (Patient not taking: No sig reported) 100 capsule 3   No facility-administered medications prior to visit.    ROS: Review of Systems  Constitutional: Negative for appetite change, fatigue and unexpected weight change.  HENT: Negative for congestion, nosebleeds, sneezing, sore throat and trouble swallowing.   Eyes: Negative for itching and visual disturbance.  Respiratory:  Negative for cough.   Cardiovascular: Negative for chest pain, palpitations and leg swelling.  Gastrointestinal: Negative for abdominal distention, blood in stool, diarrhea and nausea.  Genitourinary: Negative for frequency and hematuria.  Musculoskeletal: Negative for back pain, gait problem, joint swelling and neck pain.  Skin: Negative for rash.  Neurological: Negative for dizziness, tremors, speech difficulty and weakness.  Psychiatric/Behavioral: Negative for agitation, dysphoric mood and sleep disturbance. The patient is not nervous/anxious.     Objective:  BP 138/80 (BP Location: Left Arm)   Pulse 66   Temp 98.4 F (36.9 C) (Oral)   Ht 5' 7.5" (1.715 m)   Wt 154 lb 3.2 oz (69.9 kg)   SpO2 98%   BMI 23.79 kg/m   BP Readings from Last 3 Encounters:  09/28/20 138/80  06/02/19 134/84  05/29/18 132/84    Wt Readings from Last 3 Encounters:  09/28/20 154 lb 3.2 oz (69.9 kg)  09/04/19 153 lb (69.4 kg)  06/02/19 154 lb (69.9 kg)    Physical Exam Constitutional:      General: He is not in acute distress.    Appearance: He is well-developed.     Comments: NAD  HENT:     Mouth/Throat:     Mouth: Oropharynx is clear and moist.  Eyes:     Conjunctiva/sclera: Conjunctivae normal.     Pupils: Pupils are equal, round, and reactive to light.  Neck:     Thyroid: No thyromegaly.     Vascular: No JVD.  Cardiovascular:  Rate and Rhythm: Normal rate and regular rhythm.     Pulses: Intact distal pulses.     Heart sounds: Normal heart sounds. No murmur heard. No friction rub. No gallop.   Pulmonary:     Effort: Pulmonary effort is normal. No respiratory distress.     Breath sounds: Normal breath sounds. No wheezing or rales.  Chest:     Chest wall: No tenderness.  Abdominal:     General: Bowel sounds are normal. There is no distension.     Palpations: Abdomen is soft. There is no mass.     Tenderness: There is no abdominal tenderness. There is no guarding or rebound.   Musculoskeletal:        General: No tenderness or edema. Normal range of motion.     Cervical back: Normal range of motion.  Lymphadenopathy:     Cervical: No cervical adenopathy.  Skin:    General: Skin is warm and dry.     Findings: No rash.  Neurological:     Mental Status: He is alert and oriented to person, place, and time.     Cranial Nerves: No cranial nerve deficit.     Motor: No abnormal muscle tone.     Coordination: He displays a negative Romberg sign. Coordination normal.     Gait: Gait normal.     Deep Tendon Reflexes: Reflexes are normal and symmetric.  Psychiatric:        Mood and Affect: Mood and affect normal.        Behavior: Behavior normal.        Thought Content: Thought content normal.        Judgment: Judgment normal.   pt declined rectal exam  Lab Results  Component Value Date   WBC 5.0 06/02/2019   HGB 15.2 06/02/2019   HCT 46.2 06/02/2019   PLT 219.0 06/02/2019   GLUCOSE 87 06/02/2019   CHOL 300 (H) 06/02/2019   TRIG 102.0 06/02/2019   HDL 68.00 06/02/2019   LDLDIRECT 186.0 06/03/2012   LDLCALC 212 (H) 06/02/2019   ALT 17 06/02/2019   AST 17 06/02/2019   NA 137 06/02/2019   K 4.4 06/02/2019   CL 101 06/02/2019   CREATININE 0.80 06/02/2019   BUN 13 06/02/2019   CO2 28 06/02/2019   TSH 2.17 06/02/2019   PSA 1.45 06/02/2019    CT CARDIAC SCORING  Addendum Date: 07/01/2018   ADDENDUM REPORT: 07/01/2018 09:08 CLINICAL DATA:  Risk stratification EXAM: Coronary Calcium Score TECHNIQUE: The patient was scanned on a Siemens Somatom 64 slice scanner. Axial non-contrast 3 mm slices were carried out through the heart. The data set was analyzed on a dedicated work station and scored using the [place]. FINDINGS: Non-cardiac: See separate report from [facility]. Ascending aorta: Normal diameter 3.6 cm Pericardium: Normal Coronary arteries: Calcium noted in distal LM and ostial LAD IMPRESSION: Coronary calcium score of 8. This was 86 rd  percentile for age and sex matched control. Jenkins Rouge Electronically Signed   By: Jenkins Rouge M.D.   On: 07/01/2018 09:08   Result Date: 07/01/2018 EXAM: OVER-READ INTERPRETATION  CT CHEST The following report is an over-read performed by radiologist Dr. Abigail Miyamoto of [facility], [place] on 07/01/2018. This over-read does not include interpretation of cardiac or coronary anatomy or pathology. The calcium score interpretation by the cardiologist is attached. COMPARISON:  None. FINDINGS: Vascular: Normal aortic caliber. Mediastinum/Nodes: No imaged thoracic adenopathy. Lungs/Pleura: No imaged pleural fluid. Clustered right lower lobe  pulmonary nodules of maximally 4 mm on image 11/3. More inferior right lower lobe pulmonary nodules are tiny and calcified. Upper Abdomen: Normal imaged portions of the liver. Musculoskeletal: No acute osseous abnormality. IMPRESSION: 1.  No acute findings in the imaged extracardiac chest. 2. Clustered right lower lobe pulmonary nodules are likely post infectious or inflammatory. Maximally 4 mm. No follow-up needed if patient is low-risk. Non-contrast chest CT can be considered in 12 months if patient is high-risk. This recommendation follows the consensus statement: Guidelines for Management of Incidental Pulmonary Nodules Detected on CT Images: From the Fleischner Society 2017; Radiology 2017; 284:228-243. Electronically Signed: By: Abigail Miyamoto M.D. On: 07/01/2018 08:53    Assessment & Plan:    Walker Kehr, MD

## 2020-09-28 NOTE — Addendum Note (Signed)
Addended by: Jacobo Forest on: 09/28/2020 09:00 AM   Modules accepted: Orders

## 2020-09-29 LAB — PSA: PSA: 1.81 ng/mL (ref 0.10–4.00)

## 2020-09-29 LAB — TSH: TSH: 2.47 u[IU]/mL (ref 0.35–4.50)

## 2020-09-30 ENCOUNTER — Other Ambulatory Visit: Payer: Self-pay | Admitting: Internal Medicine

## 2020-09-30 DIAGNOSIS — E785 Hyperlipidemia, unspecified: Secondary | ICD-10-CM

## 2020-09-30 MED ORDER — ROSUVASTATIN CALCIUM 10 MG PO TABS: 10.0000 mg | ORAL_TABLET | Freq: Every day | ORAL | 3 refills | Status: DC

## 2020-12-22 ENCOUNTER — Other Ambulatory Visit: Payer: Self-pay

## 2020-12-22 ENCOUNTER — Ambulatory Visit (INDEPENDENT_AMBULATORY_CARE_PROVIDER_SITE_OTHER): Payer: PRIVATE HEALTH INSURANCE

## 2020-12-22 DIAGNOSIS — Z23 Encounter for immunization: Secondary | ICD-10-CM | POA: Diagnosis not present

## 2021-08-23 ENCOUNTER — Other Ambulatory Visit: Payer: Self-pay

## 2021-08-23 ENCOUNTER — Ambulatory Visit (INDEPENDENT_AMBULATORY_CARE_PROVIDER_SITE_OTHER): Payer: No Typology Code available for payment source

## 2021-08-23 ENCOUNTER — Encounter: Payer: Self-pay | Admitting: Internal Medicine

## 2021-08-23 ENCOUNTER — Ambulatory Visit (INDEPENDENT_AMBULATORY_CARE_PROVIDER_SITE_OTHER): Payer: No Typology Code available for payment source | Admitting: Internal Medicine

## 2021-08-23 VITALS — BP 132/82 | HR 64 | Temp 98.0°F | Ht 67.5 in | Wt 151.4 lb

## 2021-08-23 DIAGNOSIS — I2510 Atherosclerotic heart disease of native coronary artery without angina pectoris: Secondary | ICD-10-CM | POA: Diagnosis not present

## 2021-08-23 DIAGNOSIS — G8929 Other chronic pain: Secondary | ICD-10-CM

## 2021-08-23 DIAGNOSIS — M546 Pain in thoracic spine: Secondary | ICD-10-CM

## 2021-08-23 DIAGNOSIS — R0789 Other chest pain: Secondary | ICD-10-CM

## 2021-08-23 DIAGNOSIS — I2583 Coronary atherosclerosis due to lipid rich plaque: Secondary | ICD-10-CM

## 2021-08-23 DIAGNOSIS — R079 Chest pain, unspecified: Secondary | ICD-10-CM

## 2021-08-23 NOTE — Assessment & Plan Note (Addendum)
New, unclear etiology: thoracic back pain at night - strait through the front x months, short pain with position change. No GI sx's. No exertional sx's. MSK vs other Thor spine X ray Consider abd CT if not better Hold Crestor x 4 week

## 2021-08-23 NOTE — Patient Instructions (Signed)
Hold Crestor x 4 week

## 2021-08-23 NOTE — Assessment & Plan Note (Signed)
Continue with rosuvastatin if no side effects

## 2021-08-23 NOTE — Progress Notes (Signed)
Subjective:  Patient ID: Chase Bennett, male    DOB: [birth year]  Age: 61 y.o. MRN: [id number]  CC: Follow-up (Pt states he been having pain/discomfort in chest area goes around to his back. Mainly at night)   HPI Chase Bennett presents for thoracic back pain at night.  The pain is intermittent, episodic, random.  It may go strait through the front from the back x months.pain episodes are short living, no accompanying signs.  No GI sx's. No exertional sx's  Outpatient Medications Prior to Visit  Medication Sig Dispense Refill   cholecalciferol (VITAMIN D) 1000 UNITS tablet Take 1 tablet (1,000 Units total) by mouth daily. 100 tablet 3   Cyanocobalamin (VITAMIN B-12) 1000 MCG SUBL Place 1 tablet (1,000 mcg total) under the tongue daily. 100 tablet 3   rosuvastatin (CRESTOR) 10 MG tablet Take 1 tablet (10 mg total) by mouth daily. 90 tablet 3   sildenafil (REVATIO) 20 MG tablet Take 1-3 TABLET BY MOUTH AS NEEDED 60 tablet 3   No facility-administered medications prior to visit.    ROS: Review of Systems  Constitutional:  Negative for appetite change, fatigue and unexpected weight change.  HENT:  Negative for congestion, nosebleeds, sneezing, sore throat and trouble swallowing.   Eyes:  Negative for itching and visual disturbance.  Respiratory:  Negative for cough.   Cardiovascular:  Positive for chest pain. Negative for palpitations and leg swelling.  Gastrointestinal:  Negative for abdominal distention, blood in stool, diarrhea and nausea.  Genitourinary:  Negative for frequency and hematuria.  Musculoskeletal:  Positive for back pain. Negative for gait problem, joint swelling and neck pain.  Skin:  Negative for rash.  Neurological:  Negative for dizziness, tremors, speech difficulty and weakness.  Psychiatric/Behavioral:  Negative for agitation, dysphoric mood and sleep disturbance. The patient is not nervous/anxious.    Objective:  BP 132/82 (BP Location: Left Arm)    Pulse  64    Temp 98 F (36.7 C) (Oral)    Ht 5' 7.5" (1.715 m)    Wt 151 lb 6.4 oz (68.7 kg)    SpO2 97%    BMI 23.36 kg/m   BP Readings from Last 3 Encounters:  08/23/21 132/82  09/28/20 138/80  06/02/19 134/84    Wt Readings from Last 3 Encounters:  08/23/21 151 lb 6.4 oz (68.7 kg)  09/28/20 154 lb 3.2 oz (69.9 kg)  09/04/19 153 lb (69.4 kg)    Physical Exam Constitutional:      General: He is not in acute distress.    Appearance: He is well-developed.     Comments: NAD  Eyes:     Conjunctiva/sclera: Conjunctivae normal.     Pupils: Pupils are equal, round, and reactive to light.  Neck:     Thyroid: No thyromegaly.     Vascular: No JVD.  Cardiovascular:     Rate and Rhythm: Normal rate and regular rhythm.     Heart sounds: Normal heart sounds. No murmur heard.   No friction rub. No gallop.  Pulmonary:     Effort: Pulmonary effort is normal. No respiratory distress.     Breath sounds: Normal breath sounds. No wheezing or rales.  Chest:     Chest wall: No tenderness.  Abdominal:     General: Bowel sounds are normal. There is no distension.     Palpations: Abdomen is soft. There is no mass.     Tenderness: There is no abdominal tenderness. There is no guarding or rebound.  Musculoskeletal:  General: No tenderness. Normal range of motion.     Cervical back: Normal range of motion.  Lymphadenopathy:     Cervical: No cervical adenopathy.  Skin:    General: Skin is warm and dry.     Findings: No rash.  Neurological:     Mental Status: He is alert and oriented to person, place, and time.     Cranial Nerves: No cranial nerve deficit.     Motor: No abnormal muscle tone.     Coordination: Coordination normal.     Gait: Gait normal.     Deep Tendon Reflexes: Reflexes are normal and symmetric.  Psychiatric:        Behavior: Behavior normal.        Thought Content: Thought content normal.        Judgment: Judgment normal.    Procedure: EKG Indication: chest  pain Impression: S brady. No acute changes.   Lab Results  Component Value Date   WBC 5.2 09/28/2020   HGB 15.7 09/28/2020   HCT 47.1 09/28/2020   PLT 221.0 09/28/2020   GLUCOSE 92 09/28/2020   CHOL 243 (H) 09/28/2020   TRIG 120.0 09/28/2020   HDL 87.40 09/28/2020   LDLDIRECT 186.0 06/03/2012   LDLCALC 131 (H) 09/28/2020   ALT 28 09/28/2020   AST 22 09/28/2020   NA 136 09/28/2020   K 4.8 09/28/2020   CL 102 09/28/2020   CREATININE 0.88 09/28/2020   BUN 17 09/28/2020   CO2 29 09/28/2020   TSH 2.47 09/28/2020   PSA 1.81 09/28/2020    CT CARDIAC SCORING  Addendum Date: 07/01/2018   ADDENDUM REPORT: 07/01/2018 09:08 CLINICAL DATA:  Risk stratification EXAM: Coronary Calcium Score TECHNIQUE: The patient was scanned on a Siemens Somatom 64 slice scanner. Axial non-contrast 3 mm slices were carried out through the heart. The data set was analyzed on a dedicated work station and scored using the [place]. FINDINGS: Non-cardiac: See separate report from [facility]. Ascending aorta: Normal diameter 3.6 cm Pericardium: Normal Coronary arteries: Calcium noted in distal LM and ostial LAD IMPRESSION: Coronary calcium score of 8. This was 70 rd percentile for age and sex matched control. Jenkins Rouge Electronically Signed   By: Jenkins Rouge M.D.   On: 07/01/2018 09:08   Result Date: 07/01/2018 EXAM: OVER-READ INTERPRETATION  CT CHEST The following report is an over-read performed by radiologist Dr. Abigail Miyamoto of [facility], [place] on 07/01/2018. This over-read does not include interpretation of cardiac or coronary anatomy or pathology. The calcium score interpretation by the cardiologist is attached. COMPARISON:  None. FINDINGS: Vascular: Normal aortic caliber. Mediastinum/Nodes: No imaged thoracic adenopathy. Lungs/Pleura: No imaged pleural fluid. Clustered right lower lobe pulmonary nodules of maximally 4 mm on image 11/3. More inferior right lower lobe pulmonary nodules are  tiny and calcified. Upper Abdomen: Normal imaged portions of the liver. Musculoskeletal: No acute osseous abnormality. IMPRESSION: 1.  No acute findings in the imaged extracardiac chest. 2. Clustered right lower lobe pulmonary nodules are likely post infectious or inflammatory. Maximally 4 mm. No follow-up needed if patient is low-risk. Non-contrast chest CT can be considered in 12 months if patient is high-risk. This recommendation follows the consensus statement: Guidelines for Management of Incidental Pulmonary Nodules Detected on CT Images: From the Fleischner Society 2017; Radiology 2017; 284:228-243. Electronically Signed: By: Abigail Miyamoto M.D. On: 07/01/2018 08:53    Assessment & Plan:   Problem List Items Addressed This Visit     Chest pain -  Primary   Relevant Orders   EKG 12-Lead   Chest pain, atypical    Doubt cardiac etiology.  EKG obtained.  EKG is normal with sinus bradycardia      Coronary atherosclerosis    Continue with rosuvastatin if no side effects      Thoracic back pain    New, unclear etiology: thoracic back pain at night - strait through the front x months, short pain with position change. No GI sx's. No exertional sx's. MSK vs other Thor spine X ray Consider abd CT if not better Hold Crestor x 4 week       Relevant Orders   DG Thoracic Spine W/Swimmers      No orders of the defined types were placed in this encounter.     Follow-up: Return in about 4 weeks (around 09/20/2021) for a follow-up visit.  Walker Kehr, MD

## 2021-08-23 NOTE — Assessment & Plan Note (Signed)
Doubt cardiac etiology.  EKG obtained.  EKG is normal with sinus bradycardia

## 2021-09-05 ENCOUNTER — Encounter: Payer: Self-pay | Admitting: Internal Medicine

## 2021-09-07 ENCOUNTER — Other Ambulatory Visit: Payer: Self-pay | Admitting: Internal Medicine

## 2021-09-07 MED ORDER — HYDROCORTISONE ACETATE 30 MG RE SUPP: RECTAL | 2 refills | Status: DC

## 2021-09-20 ENCOUNTER — Ambulatory Visit: Payer: No Typology Code available for payment source | Admitting: Internal Medicine

## 2021-09-20 ENCOUNTER — Other Ambulatory Visit: Payer: Self-pay

## 2021-09-20 ENCOUNTER — Encounter: Payer: Self-pay | Admitting: Internal Medicine

## 2021-09-20 VITALS — BP 138/84 | HR 64 | Temp 97.7°F | Ht 67.5 in | Wt 148.4 lb

## 2021-09-20 DIAGNOSIS — Z136 Encounter for screening for cardiovascular disorders: Secondary | ICD-10-CM

## 2021-09-20 DIAGNOSIS — R1013 Epigastric pain: Secondary | ICD-10-CM | POA: Diagnosis not present

## 2021-09-20 DIAGNOSIS — G8929 Other chronic pain: Secondary | ICD-10-CM | POA: Diagnosis not present

## 2021-09-20 DIAGNOSIS — M546 Pain in thoracic spine: Secondary | ICD-10-CM

## 2021-09-20 DIAGNOSIS — Z0000 Encounter for general adult medical examination without abnormal findings: Secondary | ICD-10-CM

## 2021-09-20 DIAGNOSIS — R0789 Other chest pain: Secondary | ICD-10-CM | POA: Diagnosis not present

## 2021-09-20 DIAGNOSIS — R109 Unspecified abdominal pain: Secondary | ICD-10-CM | POA: Insufficient documentation

## 2021-09-20 LAB — URINALYSIS
Bilirubin Urine: NEGATIVE
Hgb urine dipstick: NEGATIVE
Ketones, ur: NEGATIVE
Leukocytes,Ua: NEGATIVE
Nitrite: NEGATIVE
Specific Gravity, Urine: 1.02 (ref 1.000–1.030)
Total Protein, Urine: NEGATIVE
Urine Glucose: NEGATIVE
Urobilinogen, UA: 0.2 (ref 0.0–1.0)
pH: 7.5 (ref 5.0–8.0)

## 2021-09-20 LAB — PSA: PSA: 1.39 ng/mL (ref 0.10–4.00)

## 2021-09-20 LAB — COMPREHENSIVE METABOLIC PANEL
ALT: 17 U/L (ref 0–53)
AST: 16 U/L (ref 0–37)
Albumin: 4.6 g/dL (ref 3.5–5.2)
Alkaline Phosphatase: 33 U/L — ABNORMAL LOW (ref 39–117)
BUN: 14 mg/dL (ref 6–23)
CO2: 32 mEq/L (ref 19–32)
Calcium: 9.4 mg/dL (ref 8.4–10.5)
Chloride: 103 mEq/L (ref 96–112)
Creatinine, Ser: 0.88 mg/dL (ref 0.40–1.50)
GFR: 92.74 mL/min (ref >60.00)
Glucose, Bld: 91 mg/dL (ref 70–99)
Potassium: 5.1 mEq/L (ref 3.5–5.1)
Sodium: 138 mEq/L (ref 135–145)
Total Bilirubin: 0.8 mg/dL (ref 0.2–1.2)
Total Protein: 7.2 g/dL (ref 6.0–8.3)

## 2021-09-20 LAB — TSH: TSH: 3.16 u[IU]/mL (ref 0.35–5.50)

## 2021-09-20 LAB — CBC WITH DIFFERENTIAL/PLATELET
Basophils Absolute: 0 10*3/uL (ref 0.0–0.1)
Basophils Relative: 0.6 % (ref 0.0–3.0)
Eosinophils Absolute: 0.2 10*3/uL (ref 0.0–0.7)
Eosinophils Relative: 4.3 % (ref 0.0–5.0)
HCT: 44.7 % (ref 39.0–52.0)
Hemoglobin: 14.9 g/dL (ref 13.0–17.0)
Lymphocytes Relative: 44.2 % (ref 12.0–46.0)
Lymphs Abs: 1.7 10*3/uL (ref 0.7–4.0)
MCHC: 33.3 g/dL (ref 30.0–36.0)
MCV: 95.5 fl (ref 78.0–100.0)
Monocytes Absolute: 0.4 10*3/uL (ref 0.1–1.0)
Monocytes Relative: 10.2 % (ref 3.0–12.0)
Neutro Abs: 1.6 10*3/uL (ref 1.4–7.7)
Neutrophils Relative %: 40.7 % — ABNORMAL LOW (ref 43.0–77.0)
Platelets: 216 10*3/uL (ref 150.0–400.0)
RBC: 4.68 Mil/uL (ref 4.22–5.81)
RDW: 13.4 % (ref 11.5–15.5)
WBC: 3.8 10*3/uL — ABNORMAL LOW (ref 4.0–10.5)

## 2021-09-20 LAB — LIPID PANEL
Cholesterol: 297 mg/dL — ABNORMAL HIGH (ref 0–200)
HDL: 71.3 mg/dL (ref >39.00)
LDL Cholesterol: 206 mg/dL — ABNORMAL HIGH (ref 0–99)
NonHDL: 225.81
Total CHOL/HDL Ratio: 4
Triglycerides: 99 mg/dL (ref 0.0–149.0)
VLDL: 19.8 mg/dL (ref 0.0–40.0)

## 2021-09-20 LAB — LIPASE: Lipase: 19 U/L (ref 11.0–59.0)

## 2021-09-20 MED ORDER — HYDROCORTISONE ACETATE 25 MG RE SUPP: 25.0000 mg | Freq: Two times a day (BID) | RECTAL | 3 refills | Status: AC

## 2021-09-20 MED ORDER — FAMOTIDINE 40 MG PO TABS: 40.0000 mg | ORAL_TABLET | Freq: Every day | ORAL | 3 refills | Status: AC

## 2021-09-20 NOTE — Assessment & Plan Note (Signed)
CP is better/resolved Pt held Crestor x 4 week

## 2021-09-20 NOTE — Assessment & Plan Note (Signed)
Chronic, worse Upper abd pain - intermittent, night time Hold all alcohol use

## 2021-09-20 NOTE — Progress Notes (Signed)
Subjective:  Patient ID: Chase Bennett, male    DOB: 1960/12/05  Age: 61 y.o. MRN: [id number]  CC: Follow-up   HPI Chase Bennett presents for CP - better off Crestor. C/o upper abd pain. Pt held Crestor x 4 week.  Outpatient Medications Prior to Visit  Medication Sig Dispense Refill   cholecalciferol (VITAMIN D) 1000 UNITS tablet Take 1 tablet (1,000 Units total) by mouth daily. 100 tablet 3   Cyanocobalamin (VITAMIN B-12) 1000 MCG SUBL Place 1 tablet (1,000 mcg total) under the tongue daily. 100 tablet 3   rosuvastatin (CRESTOR) 10 MG tablet Take 1 tablet (10 mg total) by mouth daily. 90 tablet 3   sildenafil (REVATIO) 20 MG tablet Take 1-3 TABLET BY MOUTH AS NEEDED 60 tablet 3   HYDROCORTISONE ACE, RECTAL, (HEMRIL-30) 30 MG SUPP 1 PR bid prn 20 suppository 2   No facility-administered medications prior to visit.    ROS: Review of Systems  Constitutional:  Negative for appetite change, fatigue and unexpected weight change.  HENT:  Negative for congestion, nosebleeds, sneezing, sore throat and trouble swallowing.   Eyes:  Negative for itching and visual disturbance.  Respiratory:  Negative for cough.   Cardiovascular:  Negative for chest pain, palpitations and leg swelling.  Gastrointestinal:  Positive for abdominal pain. Negative for abdominal distention, blood in stool, diarrhea, nausea and vomiting.  Genitourinary:  Negative for frequency and hematuria.  Musculoskeletal:  Negative for back pain, gait problem, joint swelling and neck pain.  Skin:  Negative for rash.  Neurological:  Negative for dizziness, tremors, speech difficulty and weakness.  Psychiatric/Behavioral:  Negative for agitation, dysphoric mood and sleep disturbance. The patient is not nervous/anxious.    Objective:  BP 138/84    Pulse 64    Temp 97.7 F (36.5 C) (Oral)    Ht 5' 7.5" (1.715 m)    Wt 148 lb 6 oz (67.3 kg)    SpO2 95%    BMI 22.90 kg/m   BP Readings from Last 3 Encounters:  09/20/21  138/84  08/23/21 132/82  09/28/20 138/80    Wt Readings from Last 3 Encounters:  09/20/21 148 lb 6 oz (67.3 kg)  08/23/21 151 lb 6.4 oz (68.7 kg)  09/28/20 154 lb 3.2 oz (69.9 kg)    Physical Exam Constitutional:      General: He is not in acute distress.    Appearance: He is well-developed. He is not ill-appearing.     Comments: NAD  Eyes:     Conjunctiva/sclera: Conjunctivae normal.     Pupils: Pupils are equal, round, and reactive to light.  Neck:     Thyroid: No thyromegaly.     Vascular: No JVD.  Cardiovascular:     Rate and Rhythm: Normal rate and regular rhythm.     Heart sounds: Normal heart sounds. No murmur heard.   No friction rub. No gallop.  Pulmonary:     Effort: Pulmonary effort is normal. No respiratory distress.     Breath sounds: Normal breath sounds. No wheezing or rales.  Chest:     Chest wall: No tenderness.  Abdominal:     General: Bowel sounds are normal. There is no distension.     Palpations: Abdomen is soft. There is no mass.     Tenderness: There is no abdominal tenderness. There is no guarding or rebound.  Musculoskeletal:        General: No tenderness. Normal range of motion.     Cervical back: Normal range  of motion.  Lymphadenopathy:     Cervical: No cervical adenopathy.  Skin:    General: Skin is warm and dry.     Findings: No rash.  Neurological:     Mental Status: He is alert and oriented to person, place, and time.     Cranial Nerves: No cranial nerve deficit.     Motor: No abnormal muscle tone.     Coordination: Coordination normal.     Gait: Gait normal.     Deep Tendon Reflexes: Reflexes are normal and symmetric.  Psychiatric:        Behavior: Behavior normal.        Thought Content: Thought content normal.        Judgment: Judgment normal.   I spent 22 minutes in addition to time for CPX wellness examination in preparing to see the patient by review of recent labs, imaging and procedures, obtaining and reviewing separately  obtained history, communicating with the patient, ordering medications, tests or procedures, and documenting clinical information in the EHR including the differential diagnosis, treatment, and any further evaluation and other management of epigastric and CP, ordering CT.          Lab Results  Component Value Date   WBC 5.2 09/28/2020   HGB 15.7 09/28/2020   HCT 47.1 09/28/2020   PLT 221.0 09/28/2020   GLUCOSE 92 09/28/2020   CHOL 243 (H) 09/28/2020   TRIG 120.0 09/28/2020   HDL 87.40 09/28/2020   LDLDIRECT 186.0 06/03/2012   LDLCALC 131 (H) 09/28/2020   ALT 28 09/28/2020   AST 22 09/28/2020   NA 136 09/28/2020   K 4.8 09/28/2020   CL 102 09/28/2020   CREATININE 0.88 09/28/2020   BUN 17 09/28/2020   CO2 29 09/28/2020   TSH 2.47 09/28/2020   PSA 1.81 09/28/2020    CT CARDIAC SCORING  Addendum Date: 07/01/2018   ADDENDUM REPORT: 07/01/2018 09:08 CLINICAL DATA:  Risk stratification EXAM: Coronary Calcium Score TECHNIQUE: The patient was scanned on a Siemens Somatom 64 slice scanner. Axial non-contrast 3 mm slices were carried out through the heart. The data set was analyzed on a dedicated work station and scored using the [place]. FINDINGS: Non-cardiac: See separate report from [facility]. Ascending aorta: Normal diameter 3.6 cm Pericardium: Normal Coronary arteries: Calcium noted in distal LM and ostial LAD IMPRESSION: Coronary calcium score of 8. This was 44 rd percentile for age and sex matched control. Jenkins Rouge Electronically Signed   By: Jenkins Rouge M.D.   On: 07/01/2018 09:08   Result Date: 07/01/2018 EXAM: OVER-READ INTERPRETATION  CT CHEST The following report is an over-read performed by radiologist Dr. Abigail Miyamoto of [facility], [place] on 07/01/2018. This over-read does not include interpretation of cardiac or coronary anatomy or pathology. The calcium score interpretation by the cardiologist is attached. COMPARISON:  None. FINDINGS: Vascular:  Normal aortic caliber. Mediastinum/Nodes: No imaged thoracic adenopathy. Lungs/Pleura: No imaged pleural fluid. Clustered right lower lobe pulmonary nodules of maximally 4 mm on image 11/3. More inferior right lower lobe pulmonary nodules are tiny and calcified. Upper Abdomen: Normal imaged portions of the liver. Musculoskeletal: No acute osseous abnormality. IMPRESSION: 1.  No acute findings in the imaged extracardiac chest. 2. Clustered right lower lobe pulmonary nodules are likely post infectious or inflammatory. Maximally 4 mm. No follow-up needed if patient is low-risk. Non-contrast chest CT can be considered in 12 months if patient is high-risk. This recommendation follows the consensus statement: Guidelines for Management of  Incidental Pulmonary Nodules Detected on CT Images: From the Fleischner Society 2017; Radiology 2017; 284:228-243. Electronically Signed: By: Abigail Miyamoto M.D. On: 07/01/2018 08:53    Assessment & Plan:   Problem List Items Addressed This Visit     Abdominal pain    Chronic, worse Upper abd pain - intermittent, night time Hold all alcohol use      Relevant Orders   Lipase   Chest pain, atypical    CP is better/resolved Pt held Crestor x 4 week      Thoracic back pain    CP is better/resolved Pt held Crestor x 4 week      Relevant Orders   CT Abdomen Pelvis W Contrast   Well adult exam - Primary     We discussed age appropriate health related issues, including available/recomended screening tests and vaccinations. Labs were ordered to be later reviewed . All questions were answered. We discussed one or more of the following - seat belt use, use of sunscreen/sun exposure exercise, safe sex, fall risk reduction, second hand smoke exposure, firearm use and storage, seat belt use, a need for adhering to healthy diet and exercise. Labs were ordered.  All questions were answered. Chronic - high HDL,LDL Declined Rx. Was taking  Red rice yeast. 2019 Coronary  calcium score of 8. This was 7 rd percentile for age and sex matched control. Colon due in 2026 Dr Ardis Hughs Refused all shots in the past 2022 tDAP, Shingrix      Relevant Orders   TSH   Urinalysis   CBC with Differential/Platelet   Lipid panel   PSA   Comprehensive metabolic panel      Meds ordered this encounter  Medications   famotidine (PEPCID) 40 MG tablet    Sig: Take 1 tablet (40 mg total) by mouth daily.    Dispense:  90 tablet    Refill:  3   hydrocortisone (ANUSOL-HC) 25 MG suppository    Sig: Place 1 suppository (25 mg total) rectally 2 (two) times daily.    Dispense:  20 suppository    Refill:  3      Follow-up: Return in about 2 weeks (around 10/04/2021) for a follow-up visit.  Walker Kehr, MD

## 2021-09-20 NOTE — Assessment & Plan Note (Signed)
°  We discussed age appropriate health related issues, including available/recomended screening tests and vaccinations. Labs were ordered to be later reviewed . All questions were answered. We discussed one or more of the following - seat belt use, use of sunscreen/sun exposure exercise, safe sex, fall risk reduction, second hand smoke exposure, firearm use and storage, seat belt use, a need for adhering to healthy diet and exercise. Labs were ordered.  All questions were answered. Chronic - high HDL,LDL Declined Rx. Was taking  Red rice yeast. 2019 Coronary calcium score of 8. This was 76 rd percentile for age and sex matched control. Colon due in 2026 Dr Ardis Hughs Refused all shots in the past 2022 tDAP, Shingrix

## 2021-10-02 ENCOUNTER — Encounter: Payer: PRIVATE HEALTH INSURANCE | Admitting: Internal Medicine

## 2021-11-05 ENCOUNTER — Encounter: Payer: Self-pay | Admitting: Internal Medicine

## 2021-11-06 NOTE — Telephone Encounter (Signed)
Pls advise on what type of labs pt need done.Marland KitchenJohny Chess ?

## 2021-11-07 ENCOUNTER — Other Ambulatory Visit: Payer: Self-pay | Admitting: Internal Medicine

## 2021-11-07 DIAGNOSIS — E785 Hyperlipidemia, unspecified: Secondary | ICD-10-CM

## 2021-11-09 ENCOUNTER — Other Ambulatory Visit (INDEPENDENT_AMBULATORY_CARE_PROVIDER_SITE_OTHER): Payer: No Typology Code available for payment source

## 2021-11-09 DIAGNOSIS — E785 Hyperlipidemia, unspecified: Secondary | ICD-10-CM

## 2021-11-09 LAB — COMPREHENSIVE METABOLIC PANEL
ALT: 16 U/L (ref 0–53)
AST: 17 U/L (ref 0–37)
Albumin: 4.4 g/dL (ref 3.5–5.2)
Alkaline Phosphatase: 38 U/L — ABNORMAL LOW (ref 39–117)
BUN: 14 mg/dL (ref 6–23)
CO2: 29 mEq/L (ref 19–32)
Calcium: 9.1 mg/dL (ref 8.4–10.5)
Chloride: 106 mEq/L (ref 96–112)
Creatinine, Ser: 0.87 mg/dL (ref 0.40–1.50)
GFR: 92.97 mL/min (ref >60.00)
Glucose, Bld: 80 mg/dL (ref 70–99)
Potassium: 5 mEq/L (ref 3.5–5.1)
Sodium: 139 mEq/L (ref 135–145)
Total Bilirubin: 0.8 mg/dL (ref 0.2–1.2)
Total Protein: 7 g/dL (ref 6.0–8.3)

## 2021-11-09 LAB — LIPID PANEL
Cholesterol: 224 mg/dL — ABNORMAL HIGH (ref 0–200)
HDL: 56 mg/dL (ref >39.00)
LDL Cholesterol: 151 mg/dL — ABNORMAL HIGH (ref 0–99)
NonHDL: 168.29
Total CHOL/HDL Ratio: 4
Triglycerides: 86 mg/dL (ref 0.0–149.0)
VLDL: 17.2 mg/dL (ref 0.0–40.0)

## 2021-12-30 ENCOUNTER — Encounter: Payer: Self-pay | Admitting: Internal Medicine

## 2022-03-03 IMAGING — DX DG THORACIC SPINE 3V
3 series · 3 of 3 positions shown · non-contrast
Comparison: None.

CLINICAL DATA: Thoracic spine pain for several months without known
injury.

EXAM:
THORACIC SPINE - 3 VIEWS

[t-spine ap]
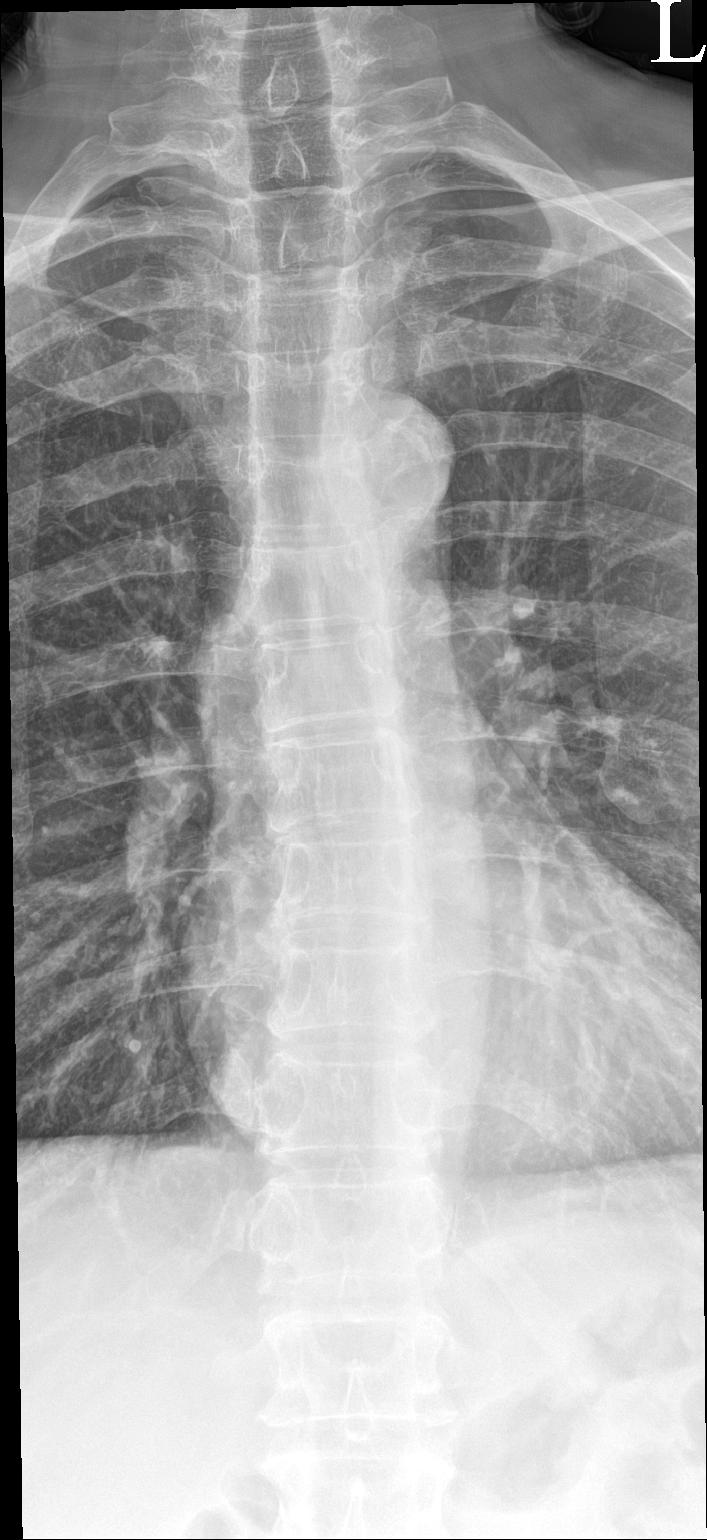

[t-spine lat]
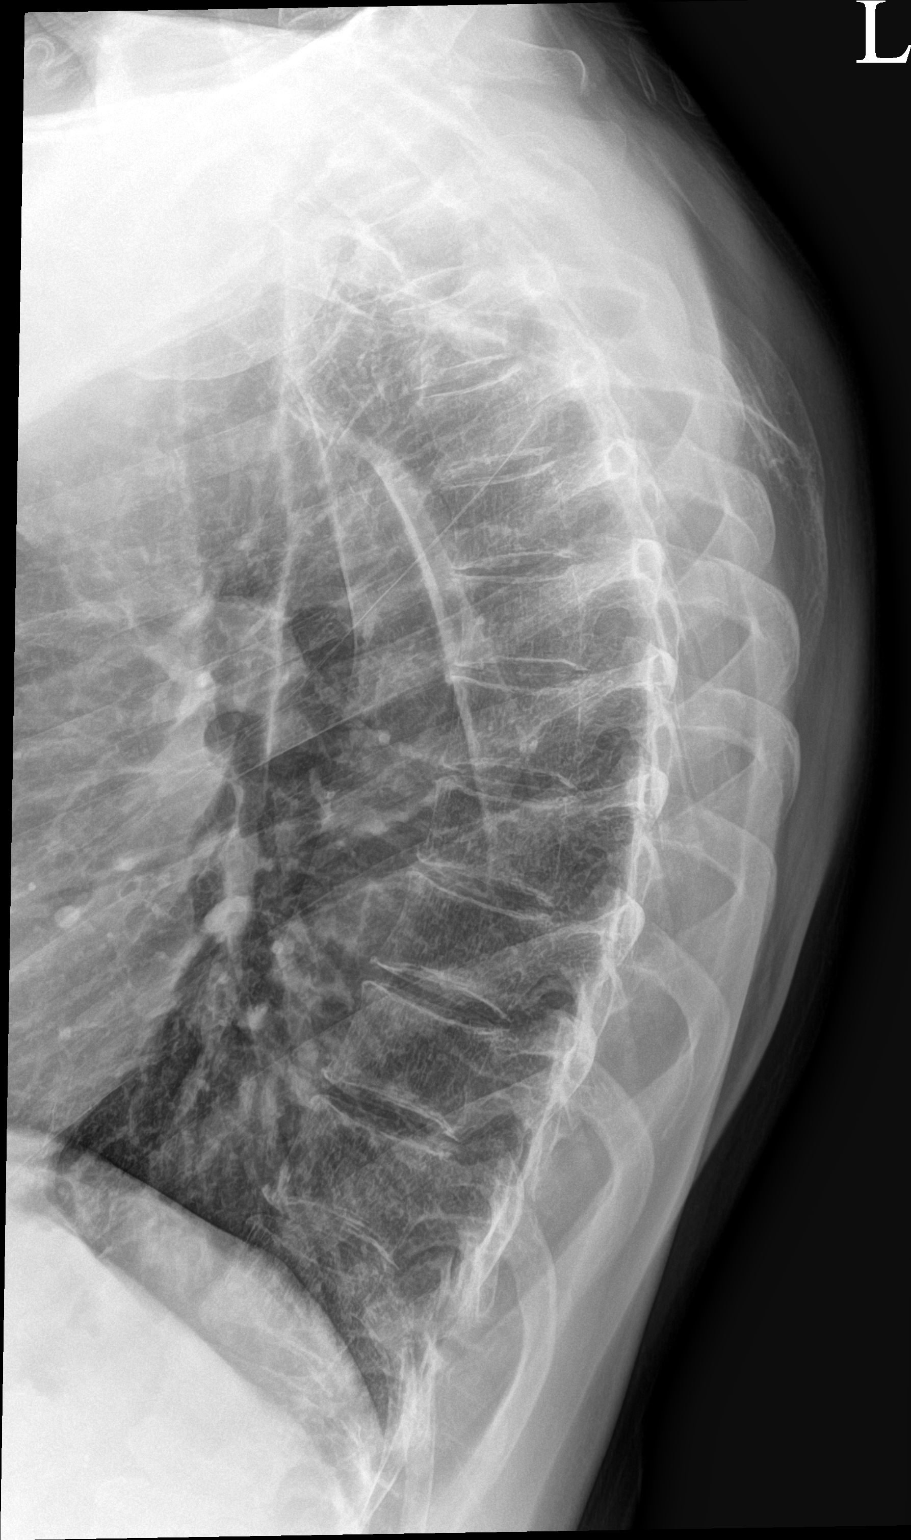

[ct-spine swimmers]
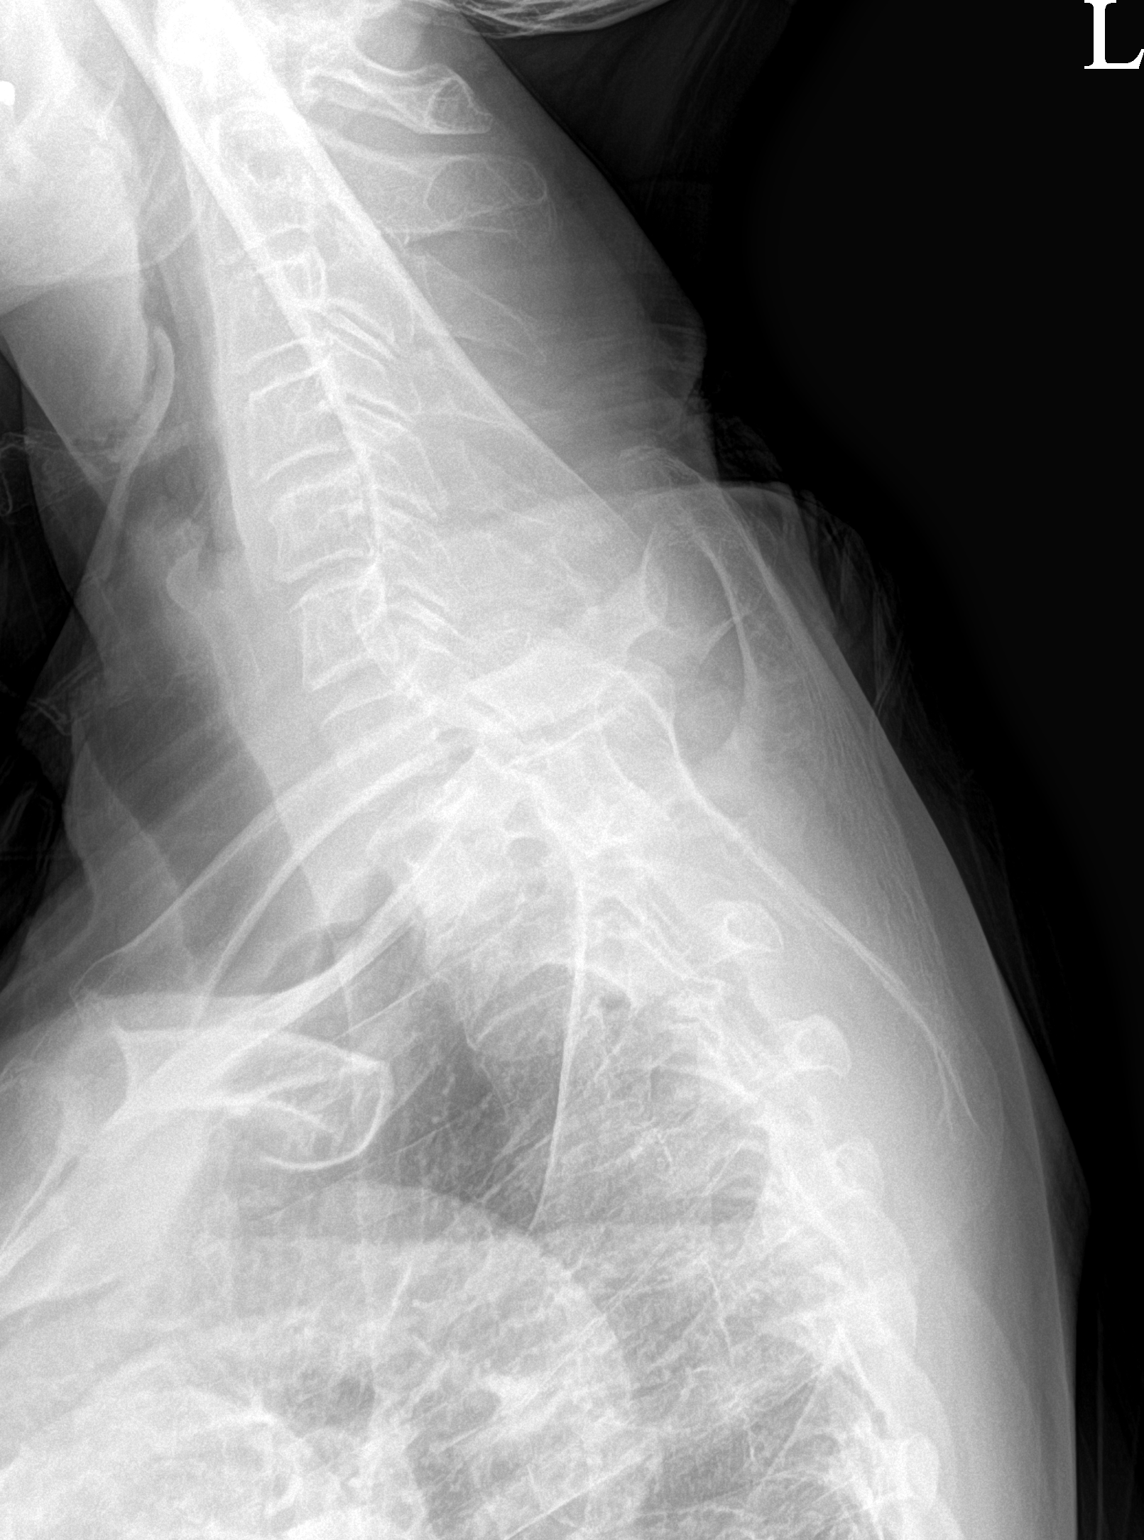

[3 of 3 positions shown; findings below may reference images not displayed]

FINDINGS: There is no evidence of thoracic spine fracture. Alignment is
normal. No other significant bone abnormalities are identified.
IMPRESSION: Negative.

## 2022-11-21 ENCOUNTER — Telehealth: Payer: Self-pay | Admitting: Internal Medicine

## 2022-11-21 DIAGNOSIS — R209 Unspecified disturbances of skin sensation: Secondary | ICD-10-CM

## 2022-11-21 DIAGNOSIS — Z0000 Encounter for general adult medical examination without abnormal findings: Secondary | ICD-10-CM

## 2022-11-21 NOTE — Telephone Encounter (Signed)
Patient would like orders for blood work to be put in prior to his appointment on 12/03/2022. Best callback is [phone].

## 2022-11-23 NOTE — Telephone Encounter (Signed)
Called and let pt know lab orders we placed, pt verbalized understanding

## 2022-11-23 NOTE — Telephone Encounter (Signed)
Okay.  Thanks.

## 2022-12-03 ENCOUNTER — Encounter: Payer: Self-pay | Admitting: Internal Medicine

## 2022-12-03 ENCOUNTER — Other Ambulatory Visit: Payer: No Typology Code available for payment source

## 2022-12-03 ENCOUNTER — Ambulatory Visit: Payer: No Typology Code available for payment source | Admitting: Internal Medicine

## 2022-12-03 VITALS — BP 124/80 | HR 74 | Temp 98.6°F | Ht 67.5 in | Wt 151.0 lb

## 2022-12-03 DIAGNOSIS — E538 Deficiency of other specified B group vitamins: Secondary | ICD-10-CM | POA: Insufficient documentation

## 2022-12-03 DIAGNOSIS — R972 Elevated prostate specific antigen [PSA]: Secondary | ICD-10-CM | POA: Diagnosis not present

## 2022-12-03 DIAGNOSIS — E785 Hyperlipidemia, unspecified: Secondary | ICD-10-CM

## 2022-12-03 DIAGNOSIS — Z0000 Encounter for general adult medical examination without abnormal findings: Secondary | ICD-10-CM

## 2022-12-03 DIAGNOSIS — E559 Vitamin D deficiency, unspecified: Secondary | ICD-10-CM | POA: Diagnosis not present

## 2022-12-03 DIAGNOSIS — R209 Unspecified disturbances of skin sensation: Secondary | ICD-10-CM | POA: Diagnosis not present

## 2022-12-03 LAB — URINALYSIS
Bilirubin Urine: NEGATIVE
Hgb urine dipstick: NEGATIVE
Ketones, ur: NEGATIVE
Leukocytes,Ua: NEGATIVE
Nitrite: NEGATIVE
Specific Gravity, Urine: >=1.03 — AB (ref 1.000–1.030)
Total Protein, Urine: NEGATIVE
Urine Glucose: NEGATIVE
Urobilinogen, UA: 0.2 (ref 0.0–1.0)
pH: 6 (ref 5.0–8.0)

## 2022-12-03 LAB — COMPREHENSIVE METABOLIC PANEL
ALT: 24 U/L (ref 0–53)
AST: 20 U/L (ref 0–37)
Albumin: 4.3 g/dL (ref 3.5–5.2)
Alkaline Phosphatase: 39 U/L (ref 39–117)
BUN: 23 mg/dL (ref 6–23)
CO2: 27 mEq/L (ref 19–32)
Calcium: 9.5 mg/dL (ref 8.4–10.5)
Chloride: 104 mEq/L (ref 96–112)
Creatinine, Ser: 0.87 mg/dL (ref 0.40–1.50)
GFR: 92.28 mL/min (ref >60.00)
Glucose, Bld: 98 mg/dL (ref 70–99)
Potassium: 4.9 mEq/L (ref 3.5–5.1)
Sodium: 140 mEq/L (ref 135–145)
Total Bilirubin: 0.7 mg/dL (ref 0.2–1.2)
Total Protein: 7 g/dL (ref 6.0–8.3)

## 2022-12-03 LAB — CBC WITH DIFFERENTIAL/PLATELET
Basophils Absolute: 0 10*3/uL (ref 0.0–0.1)
Basophils Relative: 0.8 % (ref 0.0–3.0)
Eosinophils Absolute: 0.2 10*3/uL (ref 0.0–0.7)
Eosinophils Relative: 4.2 % (ref 0.0–5.0)
HCT: 43.9 % (ref 39.0–52.0)
Hemoglobin: 14.9 g/dL (ref 13.0–17.0)
Lymphocytes Relative: 41.1 % (ref 12.0–46.0)
Lymphs Abs: 2.2 10*3/uL (ref 0.7–4.0)
MCHC: 34 g/dL (ref 30.0–36.0)
MCV: 94.7 fl (ref 78.0–100.0)
Monocytes Absolute: 0.4 10*3/uL (ref 0.1–1.0)
Monocytes Relative: 8 % (ref 3.0–12.0)
Neutro Abs: 2.4 10*3/uL (ref 1.4–7.7)
Neutrophils Relative %: 45.9 % (ref 43.0–77.0)
Platelets: 234 10*3/uL (ref 150.0–400.0)
RBC: 4.64 Mil/uL (ref 4.22–5.81)
RDW: 13.2 % (ref 11.5–15.5)
WBC: 5.3 10*3/uL (ref 4.0–10.5)

## 2022-12-03 LAB — LIPID PANEL
Cholesterol: 265 mg/dL — ABNORMAL HIGH (ref 0–200)
HDL: 67.8 mg/dL (ref >39.00)
LDL Cholesterol: 165 mg/dL — ABNORMAL HIGH (ref 0–99)
NonHDL: 197.25
Total CHOL/HDL Ratio: 4
Triglycerides: 161 mg/dL — ABNORMAL HIGH (ref 0.0–149.0)
VLDL: 32.2 mg/dL (ref 0.0–40.0)

## 2022-12-03 LAB — VITAMIN D 25 HYDROXY (VIT D DEFICIENCY, FRACTURES): VITD: 36.79 ng/mL (ref 30.00–100.00)

## 2022-12-03 LAB — VITAMIN B12: Vitamin B-12: 245 pg/mL (ref 211–911)

## 2022-12-03 LAB — PSA: PSA: 2.28 ng/mL (ref 0.10–4.00)

## 2022-12-03 LAB — TSH: TSH: 2.07 u[IU]/mL (ref 0.35–5.50)

## 2022-12-03 MED ORDER — VITAMIN D3 50 MCG (2000 UT) PO CAPS: 2000.0000 [IU] | ORAL_CAPSULE | Freq: Every day | ORAL | 3 refills | Status: AC

## 2022-12-03 MED ORDER — CYANOCOBALAMIN 1000 MCG/ML IJ SOLN
1000.0000 ug | Freq: Once | INTRAMUSCULAR | Status: AC
Start: 2022-12-03 — End: 2022-12-03
  Administered 2022-12-03: 1000 ug via INTRAMUSCULAR

## 2022-12-03 MED ORDER — VITAMIN B-12 1000 MCG SL SUBL: 1.0000 | SUBLINGUAL_TABLET | Freq: Every day | SUBLINGUAL | 3 refills | Status: AC

## 2022-12-03 MED ORDER — PRAVASTATIN SODIUM 20 MG PO TABS: 20.0000 mg | ORAL_TABLET | Freq: Every day | ORAL | 3 refills | Status: DC

## 2022-12-03 NOTE — Assessment & Plan Note (Addendum)
On B12 - low levels Start SL B12 5000 mcg Will qive SQ inj

## 2022-12-03 NOTE — Assessment & Plan Note (Signed)
°  We discussed age appropriate health related issues, including available/recomended screening tests and vaccinations. Labs were ordered to be later reviewed . All questions were answered. We discussed one or more of the following - seat belt use, use of sunscreen/sun exposure exercise, safe sex, fall risk reduction, second hand smoke exposure, firearm use and storage, seat belt use, a need for adhering to healthy diet and exercise. °Labs were ordered.  All questions were answered. °Chronic - high HDL,LDL °Declined Rx. Was taking  Red rice yeast. °2019 Coronary calcium score of 8. This was 43 rd percentile for age and °sex matched control. °Colon due in 2026 Dr Jacobs °Refused all shots in the past °2022 tDAP, Shingrix °

## 2022-12-03 NOTE — Patient Instructions (Addendum)
HOKA shoes  Merrell shoes  Spenco insoles  Blue-Emu cream 2-3 /day

## 2022-12-03 NOTE — Progress Notes (Signed)
Subjective:  Patient ID: Chase Bennett, male    DOB: [birth year]  Age: 62 y.o. MRN: [id number]  CC: Annual Exam   HPI Chase Bennett presents for a well exam  Outpatient Medications Prior to Visit  Medication Sig Dispense Refill   famotidine (PEPCID) 40 MG tablet Take 1 tablet (40 mg total) by mouth daily. 90 tablet 3   sildenafil (REVATIO) 20 MG tablet Take 1-3 TABLET BY MOUTH AS NEEDED 60 tablet 3   cholecalciferol (VITAMIN D) 1000 UNITS tablet Take 1 tablet (1,000 Units total) by mouth daily. 100 tablet 3   Cyanocobalamin (VITAMIN B-12) 1000 MCG SUBL Place 1 tablet (1,000 mcg total) under the tongue daily. 100 tablet 3   rosuvastatin (CRESTOR) 10 MG tablet Take 1 tablet (10 mg total) by mouth daily. 90 tablet 3   No facility-administered medications prior to visit.    ROS: Review of Systems  Constitutional:  Negative for appetite change, fatigue and unexpected weight change.  HENT:  Negative for congestion, nosebleeds, sneezing, sore throat and trouble swallowing.   Eyes:  Negative for itching and visual disturbance.  Respiratory:  Negative for cough.   Cardiovascular:  Negative for chest pain, palpitations and leg swelling.  Gastrointestinal:  Negative for abdominal distention, blood in stool, diarrhea and nausea.  Genitourinary:  Negative for frequency and hematuria.  Musculoskeletal:  Negative for back pain, gait problem, joint swelling and neck pain.  Skin:  Negative for rash.  Neurological:  Negative for dizziness, tremors, speech difficulty and weakness.  Psychiatric/Behavioral:  Negative for agitation, dysphoric mood, sleep disturbance and suicidal ideas. The patient is not nervous/anxious.     Objective:  BP 124/80 (BP Location: Right Arm, Patient Position: Sitting, Cuff Size: Large)   Pulse 74   Temp 98.6 F (37 C) (Oral)   Ht 5' 7.5" (1.715 m)   Wt 151 lb (68.5 kg)   SpO2 97%   BMI 23.30 kg/m   BP Readings from Last 3 Encounters:  12/03/22 124/80   09/20/21 138/84  08/23/21 132/82    Wt Readings from Last 3 Encounters:  12/03/22 151 lb (68.5 kg)  09/20/21 148 lb 6 oz (67.3 kg)  08/23/21 151 lb 6.4 oz (68.7 kg)    Physical Exam Constitutional:      General: He is not in acute distress.    Appearance: Normal appearance. He is well-developed.     Comments: NAD  Eyes:     Conjunctiva/sclera: Conjunctivae normal.     Pupils: Pupils are equal, round, and reactive to light.  Neck:     Thyroid: No thyromegaly.     Vascular: No JVD.  Cardiovascular:     Rate and Rhythm: Normal rate and regular rhythm.     Heart sounds: Normal heart sounds. No murmur heard.    No friction rub. No gallop.  Pulmonary:     Effort: Pulmonary effort is normal. No respiratory distress.     Breath sounds: Normal breath sounds. No wheezing or rales.  Chest:     Chest wall: No tenderness.  Abdominal:     General: Bowel sounds are normal. There is no distension.     Palpations: Abdomen is soft. There is no mass.     Tenderness: There is no abdominal tenderness. There is no guarding or rebound.  Musculoskeletal:        General: No tenderness. Normal range of motion.     Cervical back: Normal range of motion.  Lymphadenopathy:     Cervical: No cervical  adenopathy.  Skin:    General: Skin is warm and dry.     Findings: No rash.  Neurological:     Mental Status: He is alert and oriented to person, place, and time.     Cranial Nerves: No cranial nerve deficit.     Motor: No abnormal muscle tone.     Coordination: Coordination normal.     Gait: Gait normal.     Deep Tendon Reflexes: Reflexes are normal and symmetric.  Psychiatric:        Behavior: Behavior normal.        Thought Content: Thought content normal.        Judgment: Judgment normal.     Lab Results  Component Value Date   WBC 5.3 12/03/2022   HGB 14.9 12/03/2022   HCT 43.9 12/03/2022   PLT 234.0 12/03/2022   GLUCOSE 98 12/03/2022   CHOL 265 (H) 12/03/2022   TRIG 161.0 (H)  12/03/2022   HDL 67.80 12/03/2022   LDLDIRECT 186.0 06/03/2012   LDLCALC 165 (H) 12/03/2022   ALT 24 12/03/2022   AST 20 12/03/2022   NA 140 12/03/2022   K 4.9 12/03/2022   CL 104 12/03/2022   CREATININE 0.87 12/03/2022   BUN 23 12/03/2022   CO2 27 12/03/2022   TSH 2.07 12/03/2022   PSA 2.28 12/03/2022    CT CARDIAC SCORING  Addendum Date: 07/01/2018   ADDENDUM REPORT: 07/01/2018 09:08 CLINICAL DATA:  Risk stratification EXAM: Coronary Calcium Score TECHNIQUE: The patient was scanned on a Siemens Somatom 64 slice scanner. Axial non-contrast 3 mm slices were carried out through the heart. The data set was analyzed on a dedicated work station and scored using the Agatson method. FINDINGS: Non-cardiac: See separate report from [facility]. Ascending aorta: Normal diameter 3.6 cm Pericardium: Normal Coronary arteries: Calcium noted in distal LM and ostial LAD IMPRESSION: Coronary calcium score of 8. This was 27 rd percentile for age and sex matched control. Charlton Haws Electronically Signed   By: Charlton Haws M.D.   On: 07/01/2018 09:08   Result Date: 07/01/2018 EXAM: OVER-READ INTERPRETATION  CT CHEST The following report is an over-read performed by radiologist Dr. Jeronimo Greaves of [facility] on 07/01/2018. This over-read does not include interpretation of cardiac or coronary anatomy or pathology. The calcium score interpretation by the cardiologist is attached. COMPARISON:  None. FINDINGS: Vascular: Normal aortic caliber. Mediastinum/Nodes: No imaged thoracic adenopathy. Lungs/Pleura: No imaged pleural fluid. Clustered right lower lobe pulmonary nodules of maximally 4 mm on image 11/3. More inferior right lower lobe pulmonary nodules are tiny and calcified. Upper Abdomen: Normal imaged portions of the liver. Musculoskeletal: No acute osseous abnormality. IMPRESSION: 1.  No acute findings in the imaged extracardiac chest. 2. Clustered right lower lobe pulmonary nodules are  likely post infectious or inflammatory. Maximally 4 mm. No follow-up needed if patient is low-risk. Non-contrast chest CT can be considered in 12 months if patient is high-risk. This recommendation follows the consensus statement: Guidelines for Management of Incidental Pulmonary Nodules Detected on CT Images: From the Fleischner Society 2017; Radiology 2017; 284:228-243. Electronically Signed: By: Jeronimo Greaves M.D. On: 07/01/2018 08:53    Assessment & Plan:   Problem List Items Addressed This Visit     Dyslipidemia    Continue on Crestor      Relevant Medications   pravastatin (PRAVACHOL) 20 MG tablet   Other Relevant Orders   Comprehensive metabolic panel   Lipid panel   Well adult exam -  Primary     We discussed age appropriate health related issues, including available/recomended screening tests and vaccinations. Labs were ordered to be later reviewed . All questions were answered. We discussed one or more of the following - seat belt use, use of sunscreen/sun exposure exercise, safe sex, fall risk reduction, second hand smoke exposure, firearm use and storage, seat belt use, a need for adhering to healthy diet and exercise. Labs were ordered.  All questions were answered. Chronic - high HDL,LDL Declined Rx. Was taking  Red rice yeast. 2019 Coronary calcium score of 8. This was 64 rd percentile for age and sex matched control. Colon due in 2026 Dr Christella Hartigan Refused all shots in the past 2022 tDAP, Shingrix      B12 deficiency    On B12 - low levels Start SL B12 5000 mcg Will qive SQ inj      Relevant Orders   Vitamin B12   Other Visit Diagnoses     Vitamin D deficiency       Relevant Orders   Comprehensive metabolic panel   VITAMIN D 25 Hydroxy (Vit-D Deficiency, Fractures)   Elevated PSA       Relevant Orders   PSA         Meds ordered this encounter  Medications   Cyanocobalamin (VITAMIN B-12) 1000 MCG SUBL    Sig: Place 1 tablet (1,000 mcg total) under the  tongue daily.    Dispense:  5000 tablet    Refill:  3   pravastatin (PRAVACHOL) 20 MG tablet    Sig: Take 1 tablet (20 mg total) by mouth daily.    Dispense:  90 tablet    Refill:  3   Cholecalciferol (VITAMIN D3) 50 MCG (2000 UT) capsule    Sig: Take 1 capsule (2,000 Units total) by mouth daily.    Dispense:  100 capsule    Refill:  3   cyanocobalamin (VITAMIN B12) injection 1,000 mcg      Follow-up: Return in about 6 months (around 06/05/2023) for a follow-up visit.  Sonda Primes, MD

## 2022-12-10 NOTE — Assessment & Plan Note (Signed)
Continue on Crestor. 

## 2023-04-25 ENCOUNTER — Encounter: Payer: Self-pay | Admitting: Internal Medicine

## 2023-04-25 ENCOUNTER — Ambulatory Visit: Payer: No Typology Code available for payment source | Admitting: Internal Medicine

## 2023-04-25 VITALS — BP 120/70 | HR 73 | Temp 98.6°F | Ht 67.5 in | Wt 153.0 lb

## 2023-04-25 DIAGNOSIS — E538 Deficiency of other specified B group vitamins: Secondary | ICD-10-CM

## 2023-04-25 DIAGNOSIS — K219 Gastro-esophageal reflux disease without esophagitis: Secondary | ICD-10-CM | POA: Diagnosis not present

## 2023-04-25 DIAGNOSIS — I2510 Atherosclerotic heart disease of native coronary artery without angina pectoris: Secondary | ICD-10-CM

## 2023-04-25 DIAGNOSIS — I2583 Coronary atherosclerosis due to lipid rich plaque: Secondary | ICD-10-CM

## 2023-04-25 DIAGNOSIS — R0789 Other chest pain: Secondary | ICD-10-CM

## 2023-04-25 MED ORDER — PANTOPRAZOLE SODIUM 40 MG PO TBEC
40.0000 mg | DELAYED_RELEASE_TABLET | Freq: Every day | ORAL | 1 refills | Status: DC
Start: 2023-04-25 — End: 2023-10-18

## 2023-04-25 NOTE — Assessment & Plan Note (Signed)
Likely causing chest burning

## 2023-04-25 NOTE — Assessment & Plan Note (Signed)
Treat GERD GI ref if not better

## 2023-04-25 NOTE — Progress Notes (Signed)
Subjective:  Patient ID: Chase Bennett, male    DOB: Feb 11, 1961  Age: 62 y.o. MRN: [id number]  CC: Chest Pain (Soreness in chest and diaphragm )   HPI Chase Bennett presents for chest discomfort in the sternal area - constant burning x 2 months. Worse when drinking hot coffee, No DOE, SOB  Outpatient Medications Prior to Visit  Medication Sig Dispense Refill   Cholecalciferol (VITAMIN D3) 50 MCG (2000 UT) capsule Take 1 capsule (2,000 Units total) by mouth daily. 100 capsule 3   Cyanocobalamin (VITAMIN B-12) 1000 MCG SUBL Place 1 tablet (1,000 mcg total) under the tongue daily. 5000 tablet 3   famotidine (PEPCID) 40 MG tablet Take 1 tablet (40 mg total) by mouth daily. 90 tablet 3   pravastatin (PRAVACHOL) 20 MG tablet Take 1 tablet (20 mg total) by mouth daily. 90 tablet 3   sildenafil (REVATIO) 20 MG tablet Take 1-3 TABLET BY MOUTH AS NEEDED 60 tablet 3   No facility-administered medications prior to visit.    ROS: Review of Systems  Constitutional:  Negative for appetite change, fatigue and unexpected weight change.  HENT:  Negative for congestion, nosebleeds, sneezing, sore throat and trouble swallowing.   Eyes:  Negative for itching and visual disturbance.  Respiratory:  Negative for cough.   Cardiovascular:  Positive for chest pain. Negative for palpitations and leg swelling.  Gastrointestinal:  Negative for abdominal distention, blood in stool, diarrhea and nausea.  Genitourinary:  Negative for frequency and hematuria.  Musculoskeletal:  Negative for back pain, gait problem, joint swelling and neck pain.  Skin:  Negative for rash.  Neurological:  Negative for dizziness, tremors, speech difficulty and weakness.  Psychiatric/Behavioral:  Negative for agitation, dysphoric mood and sleep disturbance. The patient is not nervous/anxious.     Objective:  BP 120/70 (BP Location: Right Arm, Patient Position: Sitting, Cuff Size: Normal)   Pulse 73   Temp 98.6 F (37 C)  (Oral)   Ht 5' 7.5" (1.715 m)   Wt 153 lb (69.4 kg)   SpO2 96%   BMI 23.61 kg/m   BP Readings from Last 3 Encounters:  04/25/23 120/70  12/03/22 124/80  09/20/21 138/84    Wt Readings from Last 3 Encounters:  04/25/23 153 lb (69.4 kg)  12/03/22 151 lb (68.5 kg)  09/20/21 148 lb 6 oz (67.3 kg)    Physical Exam Constitutional:      General: He is not in acute distress.    Appearance: Normal appearance. He is well-developed.     Comments: NAD  Eyes:     Conjunctiva/sclera: Conjunctivae normal.     Pupils: Pupils are equal, round, and reactive to light.  Neck:     Thyroid: No thyromegaly.     Vascular: No JVD.  Cardiovascular:     Rate and Rhythm: Normal rate and regular rhythm.     Heart sounds: Normal heart sounds. No murmur heard.    No friction rub. No gallop.  Pulmonary:     Effort: Pulmonary effort is normal. No respiratory distress.     Breath sounds: Normal breath sounds. No wheezing or rales.  Chest:     Chest wall: No tenderness.  Abdominal:     General: Bowel sounds are normal. There is no distension.     Palpations: Abdomen is soft. There is no mass.     Tenderness: There is no abdominal tenderness. There is no guarding or rebound.  Musculoskeletal:        General: No tenderness.  Normal range of motion.     Cervical back: Normal range of motion.  Lymphadenopathy:     Cervical: No cervical adenopathy.  Skin:    General: Skin is warm and dry.     Findings: No rash.  Neurological:     Mental Status: He is alert and oriented to person, place, and time.     Cranial Nerves: No cranial nerve deficit.     Motor: No abnormal muscle tone.     Coordination: Coordination normal.     Gait: Gait normal.     Deep Tendon Reflexes: Reflexes are normal and symmetric.  Psychiatric:        Behavior: Behavior normal.        Thought Content: Thought content normal.        Judgment: Judgment normal.     Lab Results  Component Value Date   WBC 5.3 12/03/2022   HGB  14.9 12/03/2022   HCT 43.9 12/03/2022   PLT 234.0 12/03/2022   GLUCOSE 98 12/03/2022   CHOL 265 (H) 12/03/2022   TRIG 161.0 (H) 12/03/2022   HDL 67.80 12/03/2022   LDLDIRECT 186.0 06/03/2012   LDLCALC 165 (H) 12/03/2022   ALT 24 12/03/2022   AST 20 12/03/2022   NA 140 12/03/2022   K 4.9 12/03/2022   CL 104 12/03/2022   CREATININE 0.87 12/03/2022   BUN 23 12/03/2022   CO2 27 12/03/2022   TSH 2.07 12/03/2022   PSA 2.28 12/03/2022    CT CARDIAC SCORING  Addendum Date: 07/01/2018   ADDENDUM REPORT: 07/01/2018 09:08 CLINICAL DATA:  Risk stratification EXAM: Coronary Calcium Score TECHNIQUE: The patient was scanned on a Siemens Somatom 64 slice scanner. Axial non-contrast 3 mm slices were carried out through the heart. The data set was analyzed on a dedicated work station and scored using the Agatson method. FINDINGS: Non-cardiac: See separate report from [facility]. Ascending aorta: Normal diameter 3.6 cm Pericardium: Normal Coronary arteries: Calcium noted in distal LM and ostial LAD IMPRESSION: Coronary calcium score of 8. This was 27 rd percentile for age and sex matched control. Charlton Haws Electronically Signed   By: Charlton Haws M.D.   On: 07/01/2018 09:08   Result Date: 07/01/2018 EXAM: OVER-READ INTERPRETATION  CT CHEST The following report is an over-read performed by radiologist Dr. Jeronimo Greaves of [facility] on 07/01/2018. This over-read does not include interpretation of cardiac or coronary anatomy or pathology. The calcium score interpretation by the cardiologist is attached. COMPARISON:  None. FINDINGS: Vascular: Normal aortic caliber. Mediastinum/Nodes: No imaged thoracic adenopathy. Lungs/Pleura: No imaged pleural fluid. Clustered right lower lobe pulmonary nodules of maximally 4 mm on image 11/3. More inferior right lower lobe pulmonary nodules are tiny and calcified. Upper Abdomen: Normal imaged portions of the liver. Musculoskeletal: No acute osseous  abnormality. IMPRESSION: 1.  No acute findings in the imaged extracardiac chest. 2. Clustered right lower lobe pulmonary nodules are likely post infectious or inflammatory. Maximally 4 mm. No follow-up needed if patient is low-risk. Non-contrast chest CT can be considered in 12 months if patient is high-risk. This recommendation follows the consensus statement: Guidelines for Management of Incidental Pulmonary Nodules Detected on CT Images: From the Fleischner Society 2017; Radiology 2017; 284:228-243. Electronically Signed: By: Jeronimo Greaves M.D. On: 07/01/2018 08:53    Assessment & Plan:   Problem List Items Addressed This Visit     Coronary atherosclerosis    Continue with rosuvastatin if no side effects  Chest pain, atypical    Treat GERD GI ref if not better      B12 deficiency    On B12 SL      GERD (gastroesophageal reflux disease) - Primary    Likely causing chest burning      Relevant Medications   pantoprazole (PROTONIX) 40 MG tablet      Meds ordered this encounter  Medications   pantoprazole (PROTONIX) 40 MG tablet    Sig: Take 1 tablet (40 mg total) by mouth daily.    Dispense:  90 tablet    Refill:  1      Follow-up: Return in about 3 months (around 07/25/2023) for a follow-up visit.  Sonda Primes, MD

## 2023-04-25 NOTE — Assessment & Plan Note (Signed)
Continue with rosuvastatin if no side effects

## 2023-04-25 NOTE — Assessment & Plan Note (Signed)
On B12 SL

## 2023-06-22 ENCOUNTER — Other Ambulatory Visit: Payer: Self-pay | Admitting: Internal Medicine

## 2023-10-18 ENCOUNTER — Other Ambulatory Visit: Payer: Self-pay | Admitting: Internal Medicine

## 2023-11-21 ENCOUNTER — Other Ambulatory Visit: Payer: Self-pay | Admitting: Internal Medicine

## 2023-11-21 DIAGNOSIS — Z0000 Encounter for general adult medical examination without abnormal findings: Secondary | ICD-10-CM

## 2023-12-16 ENCOUNTER — Other Ambulatory Visit (INDEPENDENT_AMBULATORY_CARE_PROVIDER_SITE_OTHER)

## 2023-12-16 DIAGNOSIS — Z125 Encounter for screening for malignant neoplasm of prostate: Secondary | ICD-10-CM | POA: Diagnosis not present

## 2023-12-16 DIAGNOSIS — Z13220 Encounter for screening for lipoid disorders: Secondary | ICD-10-CM | POA: Diagnosis not present

## 2023-12-16 DIAGNOSIS — Z0000 Encounter for general adult medical examination without abnormal findings: Secondary | ICD-10-CM | POA: Diagnosis not present

## 2023-12-16 LAB — COMPREHENSIVE METABOLIC PANEL WITH GFR
ALT: 16 U/L (ref 0–53)
AST: 15 U/L (ref 0–37)
Albumin: 4.4 g/dL (ref 3.5–5.2)
Alkaline Phosphatase: 34 U/L — ABNORMAL LOW (ref 39–117)
BUN: 22 mg/dL (ref 6–23)
CO2: 29 meq/L (ref 19–32)
Calcium: 9.5 mg/dL (ref 8.4–10.5)
Chloride: 103 meq/L (ref 96–112)
Creatinine, Ser: 0.85 mg/dL (ref 0.40–1.50)
GFR: 92.25 mL/min (ref >60.00)
Glucose, Bld: 96 mg/dL (ref 70–99)
Potassium: 4.8 meq/L (ref 3.5–5.1)
Sodium: 139 meq/L (ref 135–145)
Total Bilirubin: 0.9 mg/dL (ref 0.2–1.2)
Total Protein: 7.1 g/dL (ref 6.0–8.3)

## 2023-12-16 LAB — URINALYSIS
Bilirubin Urine: NEGATIVE
Hgb urine dipstick: NEGATIVE
Ketones, ur: NEGATIVE
Leukocytes,Ua: NEGATIVE
Nitrite: NEGATIVE
Specific Gravity, Urine: 1.02 (ref 1.000–1.030)
Total Protein, Urine: NEGATIVE
Urine Glucose: NEGATIVE
Urobilinogen, UA: 0.2 (ref 0.0–1.0)
pH: 6 (ref 5.0–8.0)

## 2023-12-16 LAB — CBC WITH DIFFERENTIAL/PLATELET
Basophils Absolute: 0 10*3/uL (ref 0.0–0.1)
Basophils Relative: 0.5 % (ref 0.0–3.0)
Eosinophils Absolute: 0.3 10*3/uL (ref 0.0–0.7)
Eosinophils Relative: 5.3 % — ABNORMAL HIGH (ref 0.0–5.0)
HCT: 44 % (ref 39.0–52.0)
Hemoglobin: 14.7 g/dL (ref 13.0–17.0)
Lymphocytes Relative: 37.8 % (ref 12.0–46.0)
Lymphs Abs: 1.9 10*3/uL (ref 0.7–4.0)
MCHC: 33.4 g/dL (ref 30.0–36.0)
MCV: 94.1 fl (ref 78.0–100.0)
Monocytes Absolute: 0.5 10*3/uL (ref 0.1–1.0)
Monocytes Relative: 10.2 % (ref 3.0–12.0)
Neutro Abs: 2.3 10*3/uL (ref 1.4–7.7)
Neutrophils Relative %: 46.2 % (ref 43.0–77.0)
Platelets: 220 10*3/uL (ref 150.0–400.0)
RBC: 4.67 Mil/uL (ref 4.22–5.81)
RDW: 13.6 % (ref 11.5–15.5)
WBC: 4.9 10*3/uL (ref 4.0–10.5)

## 2023-12-16 LAB — LIPID PANEL
Cholesterol: 294 mg/dL — ABNORMAL HIGH (ref 0–200)
HDL: 62.4 mg/dL (ref >39.00)
LDL Cholesterol: 204 mg/dL — ABNORMAL HIGH (ref 0–99)
NonHDL: 231.47
Total CHOL/HDL Ratio: 5
Triglycerides: 135 mg/dL (ref 0.0–149.0)
VLDL: 27 mg/dL (ref 0.0–40.0)

## 2023-12-17 LAB — TSH: TSH: 3.02 u[IU]/mL (ref 0.35–5.50)

## 2023-12-17 LAB — PSA: PSA: 2.28 ng/mL (ref 0.10–4.00)

## 2023-12-18 ENCOUNTER — Ambulatory Visit (INDEPENDENT_AMBULATORY_CARE_PROVIDER_SITE_OTHER): Admitting: Internal Medicine

## 2023-12-18 ENCOUNTER — Encounter: Payer: Self-pay | Admitting: Internal Medicine

## 2023-12-18 VITALS — BP 114/70 | HR 88 | Temp 97.8°F | Ht 67.5 in | Wt 152.0 lb

## 2023-12-18 DIAGNOSIS — E785 Hyperlipidemia, unspecified: Secondary | ICD-10-CM | POA: Diagnosis not present

## 2023-12-18 DIAGNOSIS — M79644 Pain in right finger(s): Secondary | ICD-10-CM | POA: Diagnosis not present

## 2023-12-18 DIAGNOSIS — Z0000 Encounter for general adult medical examination without abnormal findings: Secondary | ICD-10-CM | POA: Diagnosis not present

## 2023-12-18 DIAGNOSIS — E538 Deficiency of other specified B group vitamins: Secondary | ICD-10-CM

## 2023-12-18 MED ORDER — PITAVASTATIN CALCIUM 2 MG PO TABS: 1.0000 | ORAL_TABLET | Freq: Every day | ORAL | 11 refills | Status: AC

## 2023-12-18 NOTE — Assessment & Plan Note (Signed)
 De Quervain's tenosynovitis Blue-Emu cream was recommended to use 2-3 times a day Ice

## 2023-12-18 NOTE — Patient Instructions (Signed)
 De Quervain's tenosynovitis Blue-Emu cream  use 2-3 times a day Ice

## 2023-12-18 NOTE — Assessment & Plan Note (Addendum)
 Worse D/c Pravastatin  (10 mg every other day) Start Pitavastatin Labs in 3 month

## 2023-12-18 NOTE — Assessment & Plan Note (Signed)
On B12 SL

## 2023-12-18 NOTE — Assessment & Plan Note (Signed)
°  We discussed age appropriate health related issues, including available/recomended screening tests and vaccinations. Labs were ordered to be later reviewed . All questions were answered. We discussed one or more of the following - seat belt use, use of sunscreen/sun exposure exercise, safe sex, fall risk reduction, second hand smoke exposure, firearm use and storage, seat belt use, a need for adhering to healthy diet and exercise. °Labs were ordered.  All questions were answered. °Chronic - high HDL,LDL °Declined Rx. Was taking  Red rice yeast. °2019 Coronary calcium score of 8. This was 43 rd percentile for age and °sex matched control. °Colon due in 2026 Dr Jacobs °Refused all shots in the past °2022 tDAP, Shingrix °

## 2023-12-18 NOTE — Progress Notes (Signed)
 Subjective:  Patient ID: Chase Bennett, male    DOB: Jan 06, 1961  Age: 63 y.o. MRN: [id number]  CC: Annual Exam   HPI Chase Bennett presents for a well exam C/o R thumb pain  Outpatient Medications Prior to Visit  Medication Sig Dispense Refill   Cholecalciferol (VITAMIN D3) 50 MCG (2000 UT) capsule Take 1 capsule (2,000 Units total) by mouth daily. 100 capsule 3   Cyanocobalamin  (VITAMIN B-12) 1000 MCG SUBL Place 1 tablet (1,000 mcg total) under the tongue daily. 5000 tablet 3   famotidine  (PEPCID ) 40 MG tablet Take 1 tablet (40 mg total) by mouth daily. 90 tablet 3   pantoprazole  (PROTONIX ) 40 MG tablet Take 1 tablet by mouth once daily 90 tablet 3   sildenafil  (REVATIO ) 20 MG tablet TAKE 1 TO 3 TABLETS BY MOUTH AS NEEDED 60 tablet 3   pravastatin  (PRAVACHOL ) 20 MG tablet Take 1 tablet (20 mg total) by mouth daily. 90 tablet 3   No facility-administered medications prior to visit.    ROS: Review of Systems  Constitutional:  Negative for appetite change, fatigue and unexpected weight change.  HENT:  Negative for congestion, nosebleeds, sneezing, sore throat and trouble swallowing.   Eyes:  Negative for itching and visual disturbance.  Respiratory:  Negative for cough.   Cardiovascular:  Negative for chest pain, palpitations and leg swelling.  Gastrointestinal:  Negative for abdominal distention, blood in stool, diarrhea and nausea.  Genitourinary:  Negative for frequency and hematuria.  Musculoskeletal:  Positive for arthralgias. Negative for back pain, gait problem, joint swelling and neck pain.  Skin:  Negative for rash.  Neurological:  Negative for dizziness, tremors, speech difficulty and weakness.  Psychiatric/Behavioral:  Negative for agitation, dysphoric mood and sleep disturbance. The patient is not nervous/anxious.     Objective:  BP 114/70   Pulse 88   Temp 97.8 F (36.6 C) (Oral)   Ht 5' 7.5" (1.715 m)   Wt 152 lb (68.9 kg)   SpO2 98%   BMI 23.46 kg/m    BP Readings from Last 3 Encounters:  12/18/23 114/70  04/25/23 120/70  12/03/22 124/80    Wt Readings from Last 3 Encounters:  12/18/23 152 lb (68.9 kg)  04/25/23 153 lb (69.4 kg)  12/03/22 151 lb (68.5 kg)    Physical Exam Constitutional:      General: He is not in acute distress.    Appearance: He is well-developed.     Comments: NAD  Eyes:     Conjunctiva/sclera: Conjunctivae normal.     Pupils: Pupils are equal, round, and reactive to light.  Neck:     Thyroid : No thyromegaly.     Vascular: No JVD.  Cardiovascular:     Rate and Rhythm: Normal rate and regular rhythm.     Heart sounds: Normal heart sounds. No murmur heard.    No friction rub. No gallop.  Pulmonary:     Effort: Pulmonary effort is normal. No respiratory distress.     Breath sounds: Normal breath sounds. No wheezing or rales.  Chest:     Chest wall: No tenderness.  Abdominal:     General: Bowel sounds are normal. There is no distension.     Palpations: Abdomen is soft. There is no mass.     Tenderness: There is no abdominal tenderness. There is no guarding or rebound.  Musculoskeletal:        General: No tenderness. Normal range of motion.     Cervical back: Normal range of  motion.  Lymphadenopathy:     Cervical: No cervical adenopathy.  Skin:    General: Skin is warm and dry.     Findings: No rash.  Neurological:     Mental Status: He is alert and oriented to person, place, and time.     Cranial Nerves: No cranial nerve deficit.     Motor: No abnormal muscle tone.     Coordination: Coordination normal.     Gait: Gait normal.     Deep Tendon Reflexes: Reflexes are normal and symmetric.  Psychiatric:        Behavior: Behavior normal.        Thought Content: Thought content normal.        Judgment: Judgment normal.   Thumbs NT now  Lab Results  Component Value Date   WBC 4.9 12/16/2023   HGB 14.7 12/16/2023   HCT 44.0 12/16/2023   PLT 220.0 12/16/2023   GLUCOSE 96 12/16/2023   CHOL  294 (H) 12/16/2023   TRIG 135.0 12/16/2023   HDL 62.40 12/16/2023   LDLDIRECT 186.0 06/03/2012   LDLCALC 204 (H) 12/16/2023   ALT 16 12/16/2023   AST 15 12/16/2023   NA 139 12/16/2023   K 4.8 12/16/2023   CL 103 12/16/2023   CREATININE 0.85 12/16/2023   BUN 22 12/16/2023   CO2 29 12/16/2023   TSH 3.02 12/16/2023   PSA 2.28 12/16/2023    CT CARDIAC SCORING Addendum Date: 07/01/2018 ADDENDUM REPORT: 07/01/2018 09:08 CLINICAL DATA:  Risk stratification EXAM: Coronary Calcium  Score TECHNIQUE: The patient was scanned on a Siemens Somatom 64 slice scanner. Axial non-contrast 3 mm slices were carried out through the heart. The data set was analyzed on a dedicated work station and scored using the Agatson method. FINDINGS: Non-cardiac: See separate report from [facility]. Ascending aorta: Normal diameter 3.6 cm Pericardium: Normal Coronary arteries: Calcium  noted in distal LM and ostial LAD IMPRESSION: Coronary calcium  score of 8. This was 57 rd percentile for age and sex matched control. Janelle Mediate Electronically Signed   By: Janelle Mediate M.D.   On: 07/01/2018 09:08   Result Date: 07/01/2018 EXAM: OVER-READ INTERPRETATION  CT CHEST The following report is an over-read performed by radiologist Dr. Lore Rode of [facility] on 07/01/2018. This over-read does not include interpretation of cardiac or coronary anatomy or pathology. The calcium  score interpretation by the cardiologist is attached. COMPARISON:  None. FINDINGS: Vascular: Normal aortic caliber. Mediastinum/Nodes: No imaged thoracic adenopathy. Lungs/Pleura: No imaged pleural fluid. Clustered right lower lobe pulmonary nodules of maximally 4 mm on image 11/3. More inferior right lower lobe pulmonary nodules are tiny and calcified. Upper Abdomen: Normal imaged portions of the liver. Musculoskeletal: No acute osseous abnormality. IMPRESSION: 1.  No acute findings in the imaged extracardiac chest. 2. Clustered right lower  lobe pulmonary nodules are likely post infectious or inflammatory. Maximally 4 mm. No follow-up needed if patient is low-risk. Non-contrast chest CT can be considered in 12 months if patient is high-risk. This recommendation follows the consensus statement: Guidelines for Management of Incidental Pulmonary Nodules Detected on CT Images: From the Fleischner Society 2017; Radiology 2017; 284:228-243. Electronically Signed: By: Lore Rode M.D. On: 07/01/2018 08:53    Assessment & Plan:   Problem List Items Addressed This Visit     Dyslipidemia   Worse D/c Pravastatin  (10 mg every other day) Start Pitavastatin Labs in 3 month           Relevant Medications   Pitavastatin  Calcium  2 MG TABS   Other Relevant Orders   Lipid panel   Hepatic function panel   Well adult exam - Primary    We discussed age appropriate health related issues, including available/recomended screening tests and vaccinations. Labs were ordered to be later reviewed . All questions were answered. We discussed one or more of the following - seat belt use, use of sunscreen/sun exposure exercise, safe sex, fall risk reduction, second hand smoke exposure, firearm use and storage, seat belt use, a need for adhering to healthy diet and exercise. Labs were ordered.  All questions were answered. Chronic - high HDL,LDL Declined Rx. Was taking  Red rice yeast. 2019 Coronary calcium  score of 8. This was 58 rd percentile for age and sex matched control. Colon due in 2026 Dr Howard Macho Refused all shots in the past 2022 tDAP, Shingrix      B12 deficiency   On B12 SL      Thumb pain, right   De Quervain's tenosynovitis Blue-Emu cream was recommended to use 2-3 times a day Ice         Meds ordered this encounter  Medications   Pitavastatin Calcium  2 MG TABS    Sig: Take 1 tablet (2 mg total) by mouth daily.    Dispense:  30 tablet    Refill:  11      Follow-up: Return in about 1 year (around 12/17/2024) for a  follow-up visit.  Anitra Barn, MD
